# Patient Record
Sex: Male | Born: 1935 | Race: White | Hispanic: No | Smoking: Never smoker
Health system: Southern US, Community
[De-identification: ages and names within clinical notes are randomized; demographics above are authoritative.]

## PROBLEM LIST (undated history)

## (undated) DIAGNOSIS — K219 Gastro-esophageal reflux disease without esophagitis: Secondary | ICD-10-CM

## (undated) DIAGNOSIS — H9190 Unspecified hearing loss, unspecified ear: Secondary | ICD-10-CM

## (undated) DIAGNOSIS — D229 Melanocytic nevi, unspecified: Secondary | ICD-10-CM

## (undated) DIAGNOSIS — R2689 Other abnormalities of gait and mobility: Secondary | ICD-10-CM

## (undated) DIAGNOSIS — I10 Essential (primary) hypertension: Secondary | ICD-10-CM

## (undated) DIAGNOSIS — J449 Chronic obstructive pulmonary disease, unspecified: Secondary | ICD-10-CM

## (undated) DIAGNOSIS — C4492 Squamous cell carcinoma of skin, unspecified: Secondary | ICD-10-CM

## (undated) DIAGNOSIS — Z77090 Contact with and (suspected) exposure to asbestos: Secondary | ICD-10-CM

## (undated) DIAGNOSIS — C4491 Basal cell carcinoma of skin, unspecified: Secondary | ICD-10-CM

## (undated) HISTORY — PX: OTHER SURGICAL HISTORY: SHX169

## (undated) HISTORY — PX: CHOLECYSTECTOMY: SHX55

## (undated) HISTORY — PX: APPENDECTOMY: SHX54

## (undated) HISTORY — PX: TONSILLECTOMY: SUR1361

## (undated) HISTORY — PX: HERNIA REPAIR: SHX51

---
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
## 2012-07-07 DIAGNOSIS — C4492 Squamous cell carcinoma of skin, unspecified: Secondary | ICD-10-CM

## 2012-07-07 HISTORY — DX: Squamous cell carcinoma of skin, unspecified: C44.92

## 2015-12-04 DIAGNOSIS — D229 Melanocytic nevi, unspecified: Secondary | ICD-10-CM

## 2015-12-04 HISTORY — DX: Melanocytic nevi, unspecified: D22.9

## 2017-01-21 ENCOUNTER — Emergency Department (HOSPITAL_COMMUNITY): Payer: Medicare Other

## 2017-01-21 ENCOUNTER — Emergency Department (HOSPITAL_COMMUNITY)
Admission: EM | Admit: 2017-01-21 | Discharge: 2017-01-21 | Disposition: A | Discharge status: Home / Self Care | Payer: Medicare Other | Attending: Emergency Medicine | Admitting: Emergency Medicine

## 2017-01-21 ENCOUNTER — Encounter (HOSPITAL_COMMUNITY): Payer: Self-pay

## 2017-01-21 DIAGNOSIS — Y999 Unspecified external cause status: Secondary | ICD-10-CM | POA: Diagnosis not present

## 2017-01-21 DIAGNOSIS — Z7982 Long term (current) use of aspirin: Secondary | ICD-10-CM | POA: Insufficient documentation

## 2017-01-21 DIAGNOSIS — I10 Essential (primary) hypertension: Secondary | ICD-10-CM | POA: Diagnosis not present

## 2017-01-21 DIAGNOSIS — S161XXA Strain of muscle, fascia and tendon at neck level, initial encounter: Secondary | ICD-10-CM | POA: Insufficient documentation

## 2017-01-21 DIAGNOSIS — Y9389 Activity, other specified: Secondary | ICD-10-CM | POA: Diagnosis not present

## 2017-01-21 DIAGNOSIS — Z79899 Other long term (current) drug therapy: Secondary | ICD-10-CM | POA: Diagnosis not present

## 2017-01-21 DIAGNOSIS — R51 Headache: Secondary | ICD-10-CM | POA: Diagnosis not present

## 2017-01-21 DIAGNOSIS — S199XXA Unspecified injury of neck, initial encounter: Secondary | ICD-10-CM | POA: Diagnosis present

## 2017-01-21 DIAGNOSIS — R911 Solitary pulmonary nodule: Secondary | ICD-10-CM | POA: Diagnosis not present

## 2017-01-21 DIAGNOSIS — Y929 Unspecified place or not applicable: Secondary | ICD-10-CM | POA: Diagnosis not present

## 2017-01-21 DIAGNOSIS — S139XXA Sprain of joints and ligaments of unspecified parts of neck, initial encounter: Secondary | ICD-10-CM

## 2017-01-21 DIAGNOSIS — J449 Chronic obstructive pulmonary disease, unspecified: Secondary | ICD-10-CM | POA: Insufficient documentation

## 2017-01-21 HISTORY — DX: Essential (primary) hypertension: I10

## 2017-01-21 HISTORY — DX: Contact with and (suspected) exposure to asbestos: Z77.090

## 2017-01-21 HISTORY — DX: Chronic obstructive pulmonary disease, unspecified: J44.9

## 2017-01-21 MED ORDER — IBUPROFEN 600 MG PO TABS
600.0000 mg | ORAL_TABLET | Freq: Four times a day (QID) | ORAL | 0 refills | Status: AC | PRN
Start: 2017-01-21 — End: ?

## 2017-01-21 MED ORDER — METHOCARBAMOL 500 MG PO TABS
500.0000 mg | ORAL_TABLET | Freq: Two times a day (BID) | ORAL | 0 refills | Status: AC | PRN
Start: 2017-01-21 — End: ?

## 2017-01-21 MED ORDER — IBUPROFEN 800 MG PO TABS
800.0000 mg | ORAL_TABLET | Freq: Once | ORAL | Status: AC
  Administered 2017-01-21: 800 mg via ORAL
  Filled 2017-01-21: qty 1

## 2017-01-21 MED ORDER — METHOCARBAMOL 500 MG PO TABS
500.0000 mg | ORAL_TABLET | Freq: Once | ORAL | Status: AC
  Administered 2017-01-21: 500 mg via ORAL
  Filled 2017-01-21: qty 1

## 2017-01-21 NOTE — Discharge Instructions (Signed)
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
## 2017-05-10 DIAGNOSIS — C4491 Basal cell carcinoma of skin, unspecified: Secondary | ICD-10-CM

## 2017-05-10 HISTORY — DX: Basal cell carcinoma of skin, unspecified: C44.91

## 2018-05-04 DIAGNOSIS — C4491 Basal cell carcinoma of skin, unspecified: Secondary | ICD-10-CM

## 2018-05-04 HISTORY — DX: Basal cell carcinoma of skin, unspecified: C44.91

## 2021-03-15 ENCOUNTER — Emergency Department (HOSPITAL_COMMUNITY): Payer: Medicare Other

## 2021-03-15 ENCOUNTER — Other Ambulatory Visit: Payer: Self-pay

## 2021-03-15 ENCOUNTER — Inpatient Hospital Stay (HOSPITAL_COMMUNITY)
Admission: EM | Admit: 2021-03-15 | Discharge: 2021-03-17 | DRG: 055 | Disposition: A | Discharge status: Home Health | Payer: Medicare Other | Attending: Internal Medicine | Admitting: Internal Medicine

## 2021-03-15 ENCOUNTER — Encounter (HOSPITAL_COMMUNITY): Payer: Self-pay | Admitting: Emergency Medicine

## 2021-03-15 DIAGNOSIS — J449 Chronic obstructive pulmonary disease, unspecified: Secondary | ICD-10-CM

## 2021-03-15 DIAGNOSIS — I739 Peripheral vascular disease, unspecified: Secondary | ICD-10-CM

## 2021-03-15 DIAGNOSIS — Z85828 Personal history of other malignant neoplasm of skin: Secondary | ICD-10-CM

## 2021-03-15 DIAGNOSIS — F4321 Adjustment disorder with depressed mood: Secondary | ICD-10-CM

## 2021-03-15 DIAGNOSIS — Z9049 Acquired absence of other specified parts of digestive tract: Secondary | ICD-10-CM

## 2021-03-15 DIAGNOSIS — Z77090 Contact with and (suspected) exposure to asbestos: Secondary | ICD-10-CM | POA: Diagnosis present

## 2021-03-15 DIAGNOSIS — I69398 Other sequelae of cerebral infarction: Secondary | ICD-10-CM

## 2021-03-15 DIAGNOSIS — I10 Essential (primary) hypertension: Secondary | ICD-10-CM

## 2021-03-15 DIAGNOSIS — Z66 Do not resuscitate: Secondary | ICD-10-CM | POA: Diagnosis present

## 2021-03-15 DIAGNOSIS — C719 Malignant neoplasm of brain, unspecified: Principal | ICD-10-CM | POA: Diagnosis present

## 2021-03-15 DIAGNOSIS — H532 Diplopia: Secondary | ICD-10-CM | POA: Diagnosis not present

## 2021-03-15 DIAGNOSIS — Z20822 Contact with and (suspected) exposure to covid-19: Secondary | ICD-10-CM | POA: Diagnosis present

## 2021-03-15 DIAGNOSIS — K219 Gastro-esophageal reflux disease without esophagitis: Secondary | ICD-10-CM | POA: Diagnosis present

## 2021-03-15 DIAGNOSIS — H9190 Unspecified hearing loss, unspecified ear: Secondary | ICD-10-CM | POA: Diagnosis present

## 2021-03-15 DIAGNOSIS — F4320 Adjustment disorder, unspecified: Secondary | ICD-10-CM | POA: Diagnosis present

## 2021-03-15 DIAGNOSIS — G459 Transient cerebral ischemic attack, unspecified: Secondary | ICD-10-CM

## 2021-03-15 DIAGNOSIS — Z91030 Bee allergy status: Secondary | ICD-10-CM

## 2021-03-15 DIAGNOSIS — Z888 Allergy status to other drugs, medicaments and biological substances status: Secondary | ICD-10-CM

## 2021-03-15 HISTORY — DX: Unspecified hearing loss, unspecified ear: H91.90

## 2021-03-15 HISTORY — DX: Other abnormalities of gait and mobility: R26.89

## 2021-03-15 HISTORY — DX: Gastro-esophageal reflux disease without esophagitis: K21.9

## 2021-03-15 LAB — URINALYSIS, ROUTINE W REFLEX MICROSCOPIC
Bacteria, UA: NONE SEEN
Bilirubin Urine: NEGATIVE
Glucose, UA: NEGATIVE mg/dL
Ketones, ur: NEGATIVE mg/dL
Nitrite: NEGATIVE
Protein, ur: NEGATIVE mg/dL
Specific Gravity, Urine: 1.017 (ref 1.005–1.030)
WBC, UA: >50 WBC/hpf — ABNORMAL HIGH (ref 0–5)
pH: 6 (ref 5.0–8.0)

## 2021-03-15 LAB — RAPID URINE DRUG SCREEN, HOSP PERFORMED
Amphetamines: NOT DETECTED
Barbiturates: NOT DETECTED
Benzodiazepines: POSITIVE — AB
Cocaine: NOT DETECTED
Opiates: NOT DETECTED
Tetrahydrocannabinol: NOT DETECTED

## 2021-03-15 LAB — I-STAT CHEM 8, ED
BUN: 20 mg/dL (ref 8–23)
Calcium, Ion: 1.18 mmol/L (ref 1.15–1.40)
Chloride: 99 mmol/L (ref 98–111)
Creatinine, Ser: 1.1 mg/dL (ref 0.61–1.24)
Glucose, Bld: 109 mg/dL — ABNORMAL HIGH (ref 70–99)
HCT: 46 % (ref 39.0–52.0)
Hemoglobin: 15.6 g/dL (ref 13.0–17.0)
Potassium: 3.4 mmol/L — ABNORMAL LOW (ref 3.5–5.1)
Sodium: 144 mmol/L (ref 135–145)
TCO2: 35 mmol/L — ABNORMAL HIGH (ref 22–32)

## 2021-03-15 LAB — CBC WITH DIFFERENTIAL/PLATELET
Abs Immature Granulocytes: 0.03 10*3/uL (ref 0.00–0.07)
Basophils Absolute: 0 10*3/uL (ref 0.0–0.1)
Basophils Relative: 0 %
Eosinophils Absolute: 0.5 10*3/uL (ref 0.0–0.5)
Eosinophils Relative: 5 %
HCT: 47.9 % (ref 39.0–52.0)
Hemoglobin: 14.8 g/dL (ref 13.0–17.0)
Immature Granulocytes: 0 %
Lymphocytes Relative: 39 %
Lymphs Abs: 3.6 10*3/uL (ref 0.7–4.0)
MCH: 30 pg (ref 26.0–34.0)
MCHC: 30.9 g/dL (ref 30.0–36.0)
MCV: 97.2 fL (ref 80.0–100.0)
Monocytes Absolute: 0.7 10*3/uL (ref 0.1–1.0)
Monocytes Relative: 7 %
Neutro Abs: 4.5 10*3/uL (ref 1.7–7.7)
Neutrophils Relative %: 49 %
Platelets: 136 10*3/uL — ABNORMAL LOW (ref 150–400)
RBC: 4.93 MIL/uL (ref 4.22–5.81)
RDW: 12.5 % (ref 11.5–15.5)
WBC: 9.2 10*3/uL (ref 4.0–10.5)
nRBC: 0 % (ref 0.0–0.2)

## 2021-03-15 LAB — ETHANOL: Alcohol, Ethyl (B): <10 mg/dL (ref <10)

## 2021-03-15 LAB — BASIC METABOLIC PANEL
Anion gap: 9 (ref 5–15)
BUN: 17 mg/dL (ref 8–23)
CO2: 32 mmol/L (ref 22–32)
Calcium: 8.8 mg/dL — ABNORMAL LOW (ref 8.9–10.3)
Chloride: 100 mmol/L (ref 98–111)
Creatinine, Ser: 1.07 mg/dL (ref 0.61–1.24)
GFR, Estimated: >60 mL/min (ref >60)
Glucose, Bld: 117 mg/dL — ABNORMAL HIGH (ref 70–99)
Potassium: 3.4 mmol/L — ABNORMAL LOW (ref 3.5–5.1)
Sodium: 141 mmol/L (ref 135–145)

## 2021-03-15 LAB — HEPATIC FUNCTION PANEL
ALT: 32 U/L (ref 0–44)
AST: 27 U/L (ref 15–41)
Albumin: 3.6 g/dL (ref 3.5–5.0)
Alkaline Phosphatase: 72 U/L (ref 38–126)
Bilirubin, Direct: 0.1 mg/dL (ref 0.0–0.2)
Indirect Bilirubin: 0.5 mg/dL (ref 0.3–0.9)
Total Bilirubin: 0.6 mg/dL (ref 0.3–1.2)
Total Protein: 6.5 g/dL (ref 6.5–8.1)

## 2021-03-15 LAB — APTT: aPTT: 30 seconds (ref 24–36)

## 2021-03-15 LAB — TROPONIN I (HIGH SENSITIVITY)
Troponin I (High Sensitivity): 9 ng/L (ref <18)
Troponin I (High Sensitivity): 9 ng/L (ref <18)

## 2021-03-15 LAB — RESP PANEL BY RT-PCR (FLU A&B, COVID) ARPGX2
Influenza A by PCR: NEGATIVE
Influenza B by PCR: NEGATIVE
SARS Coronavirus 2 by RT PCR: NEGATIVE

## 2021-03-15 LAB — PROTIME-INR
INR: 1 (ref 0.8–1.2)
Prothrombin Time: 12.4 seconds (ref 11.4–15.2)

## 2021-03-15 MED ORDER — STROKE: EARLY STAGES OF RECOVERY BOOK
Freq: Once | Status: DC
  Filled 2021-03-15: qty 1

## 2021-03-15 MED ORDER — HYDRALAZINE HCL 20 MG/ML IJ SOLN
5.0000 mg | INTRAMUSCULAR | Status: DC | PRN
  Administered 2021-03-16: 5 mg via INTRAVENOUS
  Filled 2021-03-15: qty 1

## 2021-03-15 MED ORDER — SENNOSIDES-DOCUSATE SODIUM 8.6-50 MG PO TABS
1.0000 | ORAL_TABLET | Freq: Every day | ORAL | Status: DC
  Administered 2021-03-15 – 2021-03-16 (×2): 1 via ORAL
  Filled 2021-03-15 (×2): qty 1

## 2021-03-15 MED ORDER — POTASSIUM CHLORIDE CRYS ER 20 MEQ PO TBCR
20.0000 meq | EXTENDED_RELEASE_TABLET | Freq: Every day | ORAL | Status: DC
  Administered 2021-03-16 – 2021-03-17 (×2): 20 meq via ORAL
  Filled 2021-03-15 (×2): qty 1

## 2021-03-15 MED ORDER — ENOXAPARIN SODIUM 40 MG/0.4ML ~~LOC~~ SOLN
40.0000 mg | SUBCUTANEOUS | Status: DC
  Administered 2021-03-15 – 2021-03-16 (×2): 40 mg via SUBCUTANEOUS
  Filled 2021-03-15 (×2): qty 0.4

## 2021-03-15 MED ORDER — DEXTROSE-NACL 5-0.45 % IV SOLN: INTRAVENOUS | Status: DC

## 2021-03-15 MED ORDER — ASCORBIC ACID 500 MG PO TABS
1000.0000 mg | ORAL_TABLET | Freq: Every day | ORAL | Status: DC
  Administered 2021-03-16 – 2021-03-17 (×2): 1000 mg via ORAL
  Filled 2021-03-15 (×5): qty 2

## 2021-03-15 MED ORDER — ASPIRIN EC 81 MG PO TBEC
81.0000 mg | DELAYED_RELEASE_TABLET | Freq: Every day | ORAL | Status: DC
  Administered 2021-03-16 – 2021-03-17 (×2): 81 mg via ORAL
  Filled 2021-03-15 (×2): qty 1

## 2021-03-15 MED ORDER — HYDRALAZINE HCL 20 MG/ML IJ SOLN
5.0000 mg | Freq: Once | INTRAMUSCULAR | Status: AC
  Administered 2021-03-15: 5 mg via INTRAVENOUS
  Filled 2021-03-15: qty 1

## 2021-03-15 MED ORDER — ACETAMINOPHEN 160 MG/5ML PO SOLN: 650.0000 mg | ORAL | Status: DC | PRN

## 2021-03-15 MED ORDER — DIGOXIN 125 MCG PO TABS
0.1250 mg | ORAL_TABLET | ORAL | Status: DC
  Administered 2021-03-17: 0.125 mg via ORAL
  Filled 2021-03-15: qty 1

## 2021-03-15 MED ORDER — UMECLIDINIUM BROMIDE 62.5 MCG/INH IN AEPB
1.0000 | INHALATION_SPRAY | Freq: Every day | RESPIRATORY_TRACT | Status: DC
  Administered 2021-03-16 – 2021-03-17 (×2): 1 via RESPIRATORY_TRACT
  Filled 2021-03-15: qty 7

## 2021-03-15 MED ORDER — METOPROLOL SUCCINATE ER 25 MG PO TB24
75.0000 mg | ORAL_TABLET | Freq: Every day | ORAL | Status: DC
  Administered 2021-03-16 – 2021-03-17 (×2): 75 mg via ORAL
  Filled 2021-03-15 (×2): qty 1

## 2021-03-15 MED ORDER — ACETAMINOPHEN 650 MG RE SUPP: 650.0000 mg | RECTAL | Status: DC | PRN

## 2021-03-15 MED ORDER — ROSUVASTATIN CALCIUM 20 MG PO TABS
40.0000 mg | ORAL_TABLET | Freq: Every day | ORAL | Status: DC
  Administered 2021-03-15 – 2021-03-16 (×2): 40 mg via ORAL
  Filled 2021-03-15 (×2): qty 2

## 2021-03-15 MED ORDER — METHOCARBAMOL 500 MG PO TABS: 500.0000 mg | ORAL_TABLET | Freq: Two times a day (BID) | ORAL | Status: DC | PRN

## 2021-03-15 MED ORDER — HYDROCHLOROTHIAZIDE 12.5 MG PO CAPS
12.5000 mg | ORAL_CAPSULE | Freq: Every day | ORAL | Status: DC
  Administered 2021-03-16 – 2021-03-17 (×2): 12.5 mg via ORAL
  Filled 2021-03-15 (×2): qty 1

## 2021-03-15 MED ORDER — ACETAMINOPHEN 325 MG PO TABS
650.0000 mg | ORAL_TABLET | ORAL | Status: DC | PRN
  Administered 2021-03-16: 650 mg via ORAL
  Filled 2021-03-15: qty 2

## 2021-03-15 MED ORDER — ENSURE ENLIVE PO LIQD
237.0000 mL | Freq: Two times a day (BID) | ORAL | Status: DC
  Administered 2021-03-16: 237 mL via ORAL

## 2021-03-15 MED ORDER — IBUPROFEN 600 MG PO TABS: 600.0000 mg | ORAL_TABLET | Freq: Four times a day (QID) | ORAL | Status: DC | PRN

## 2021-03-15 MED ORDER — IRBESARTAN 150 MG PO TABS
150.0000 mg | ORAL_TABLET | Freq: Every day | ORAL | Status: DC
  Administered 2021-03-15 – 2021-03-17 (×3): 150 mg via ORAL
  Filled 2021-03-15 (×3): qty 1

## 2021-03-15 NOTE — ED Notes (Signed)
Son at bedside. PA notified.

## 2021-03-15 NOTE — ED Provider Notes (Signed)
[facility] EMERGENCY DEPARTMENT Provider Note   CSN: [id number] Arrival date & time: 03/15/21  1400     History Chief Complaint  Patient presents with  . Hypertension    Charles Middleton is a 87 y.o. male history of asbestos, COPD, hypertension.  Patient reports 11 days ago his wife of 37 years passed away.  He reports that since that time he has had increased stress.  His daughter has been coming over to take care of him and has been checking his blood pressure.  Patient reports that his blood pressure has been high over the past few days and reports systolics greater than 614 at home.  Patient reports that he has been taking his blood pressure medication as prescribed.  Patient reports that over the past 3-4 days he has been noticing double vision particularly in the mornings.  Patient reports his shortness of breath today is baseline he wears supplemental oxygen only at night, he does not feel that he has a COPD exacerbation at this time.  Patient denies recent illness, fever/chills, headache, confusion, difficulty speaking, chest pain/shortness of breath, cough, Donnell pain, nausea/vomiting, diarrhea or any additional concerns.  HPI     Past Medical History:  Diagnosis Date  . Asbestos exposure   . Atypical nevus 12/04/2015   mild atypia - left outer back  . Atypical nevus 05/10/2017   moderate atypia - right chest  . Basal cell carcinoma of skin 05/10/2017   Left ear rim - tx p bx  . COPD (chronic obstructive pulmonary disease) ([place])   . Hypertension   . 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 Nodular basal cell carcinoma 10/20/2018   Right side of nose - tx p bx  . Squamous cell carcinoma of skin 07/07/2012   Right ear rim - tx p bx  . Squamous cell carcinoma of skin 11/12/2014   Right hand - tx p bx  . Squamous cell carcinoma of skin 10/20/2018   Right ear rim - tx p bx    There are no problems to display for this patient.   Past  Surgical History:  Procedure Laterality Date  . APPENDECTOMY    . cataract sx    . CHOLECYSTECTOMY    . HERNIA REPAIR         History reviewed. No pertinent family history.  Social History   Tobacco Use  . Smoking status: Never Smoker  . Smokeless tobacco: Never Used  Vaping Use  . Vaping Use: Never used  Substance Use Topics  . Alcohol use: No  . Drug use: No    Home Medications Prior to Admission medications   Medication Sig Start Date End Date Taking? Authorizing Provider  Ascorbic Acid (VITAMIN C) 500 MG CAPS Take 1,000 mg by mouth daily.  11/22/11   [provider]  aspirin EC 81 MG tablet Take 81 mg by mouth daily.  11/22/11   [provider]  azelastine (ASTELIN) 0.1 % nasal spray 1 spray by Each Nare route once daily. Use in each nostril as directed    [provider]  digoxin (LANOXIN) 0.125 MG tablet Take 0.125 mg by mouth daily.  01/16/17   [provider]  hydrochlorothiazide (MICROZIDE) 12.5 MG capsule Take 12.5 mg by mouth daily. 02/02/21   [provider]  ibuprofen (ADVIL,MOTRIN) 600 MG tablet Take 1 tablet (600 mg total) by mouth every 6 (six) hours as needed. 01/21/17   Sabra Heck,  Aaron Edelman, MD  irbesartan (AVAPRO) 150 MG tablet Take 150 mg by mouth daily. 02/21/21   [provider]  methocarbamol (ROBAXIN) 500 MG tablet Take 1 tablet (500 mg total) by mouth 2 (two) times daily as needed for muscle spasms. 01/21/17   Noemi Chapel, MD  Multiple Vitamins-Minerals (COMPLETE) TABS Take 1 tablet by mouth daily.  11/22/11   [provider]  Omega-3 Fatty Acids (FISH OIL PO) Take 1 capsule by mouth daily.  11/22/11   [provider]  potassium chloride SA (KLOR-CON) 20 MEQ tablet Take 20 mEq by mouth daily. 01/23/21   [provider]  TOPROL XL 50 MG 24 hr tablet Take 75 mg by mouth daily.  11/22/16   [provider]  TUDORZA PRESSAIR 400 MCG/ACT AEPB Inhale 1 Inhaler into the lungs daily.  10/27/16    [provider]    Allergies    Amlodipine and Bee venom  Review of Systems   Review of Systems Ten systems are reviewed and are negative for acute change except as noted in the HPI  Physical Exam Updated Vital Signs BP (!) 185/77   Pulse (!) 59   Temp 97.9 F (36.6 C) (Oral)   Resp 16   Ht 6' (1.829 m)   Wt 88.5 kg   SpO2 95%   BMI 26.45 kg/m   Physical Exam Constitutional:      General: He is not in acute distress.    Appearance: Normal appearance. He is well-developed. He is not ill-appearing or diaphoretic.  HENT:     Head: Normocephalic and atraumatic.  Eyes:     General: Vision grossly intact. Gaze aligned appropriately.     Pupils: Pupils are equal, round, and reactive to light.  Neck:     Trachea: Trachea and phonation normal.  Cardiovascular:     Comments: Trace bilateral lower extremity edema. Pulmonary:     Effort: Pulmonary effort is normal. No respiratory distress.  Abdominal:     General: There is no distension.     Palpations: Abdomen is soft.     Tenderness: There is no abdominal tenderness. There is no guarding or rebound.  Musculoskeletal:        General: Normal range of motion.     Cervical back: Normal range of motion.  Skin:    General: Skin is warm and dry.  Neurological:     Mental Status: He is alert.     GCS: GCS eye subscore is 4. GCS verbal subscore is 5. GCS motor subscore is 6.     Comments: Speech is clear and goal oriented, follows commands Major Cranial nerves without deficit, no facial droop Moves extremities without ataxia, coordination intact  Psychiatric:        Behavior: Behavior normal.     ED Results / Procedures / Treatments   Labs (all labs ordered are listed, but only abnormal results are displayed) Labs Reviewed  CBC WITH DIFFERENTIAL/PLATELET - Abnormal; Notable for the following components:      Result Value   Platelets 136 (*)    All other components within normal limits  BASIC METABOLIC PANEL -  Abnormal; Notable for the following components:   Potassium 3.4 (*)    Glucose, Bld 117 (*)    Calcium 8.8 (*)    All other components within normal limits  I-STAT CHEM 8, ED - Abnormal; Notable for the following components:   Potassium 3.4 (*)    Glucose, Bld 109 (*)    TCO2  35 (*)    All other components within normal limits  RESP PANEL BY RT-PCR (FLU A&B, COVID) ARPGX2  APTT  PROTIME-INR  ETHANOL  HEPATIC FUNCTION PANEL  URINALYSIS, ROUTINE W REFLEX MICROSCOPIC  RAPID URINE DRUG SCREEN, HOSP PERFORMED  TROPONIN I (HIGH SENSITIVITY)    EKG EKG Interpretation  Date/Time:  Sunday March 15 2021 14:29:39 EDT Ventricular Rate:  58 PR Interval:    QRS Duration: 88 QT Interval:  412 QTC Calculation: 405 R Axis:   -54 Text Interpretation: Sinus rhythm Prolonged PR interval Inferior infarct, old Anterior infarct, old No previous ECGs available Confirmed by Fredia Sorrow [phone]) on 03/15/2021 2:52:03 PM   Radiology CT Head Wo Contrast  Result Date: 03/15/2021 CLINICAL DATA:  Dizziness. Hypertension and double vision for 2 days. EXAM: CT HEAD WITHOUT CONTRAST TECHNIQUE: Contiguous axial images were obtained from the base of the skull through the vertex without intravenous contrast. COMPARISON:  None. FINDINGS: Brain: No evidence of acute infarction, hemorrhage, hydrocephalus, extra-axial collection or mass lesion/mass effect. There is mild diffuse low-attenuation within the subcortical and periventricular white matter compatible with chronic microvascular disease. Prominence of sulci and ventricles compatible with brain atrophy Vascular: No hyperdense vessel or unexpected calcification. Skull: Normal. Negative for fracture or focal lesion. Sinuses/Orbits: Mild right maxillary sinus mucosal thickening. The remaining paranasal sinuses are clear. Normal appearance of the right maxillary sinus. Signs of previous left mastoidectomy identified. Other: None. IMPRESSION: 1. No acute intracranial  abnormalities. 2. Chronic small vessel ischemic disease and brain atrophy. Electronically Signed   By: Kerby Moors M.D.   On: 03/15/2021 15:49   DG Chest Portable 1 View  Result Date: 03/15/2021 CLINICAL DATA:  Hypertension. Patient reports double vision for 2 days. EXAM: PORTABLE CHEST 1 VIEW COMPARISON:  None. FINDINGS: Mild cardiomegaly. Aortic atherosclerosis. No pulmonary edema. Subsegmental opacities at the left lung base may be atelectasis or scarring. No confluent consolidation. No pneumothorax or significant pleural effusion. No acute osseous abnormalities are seen. There is colonic interposition under the right hemidiaphragm. IMPRESSION: 1. Mild cardiomegaly without congestive failure. 2. Subsegmental opacities at the left lung base may be atelectasis or scarring. Electronically Signed   By: Keith Rake M.D.   On: 03/15/2021 15:51    Procedures Procedures   Medications Ordered in ED Medications - No data to display  ED Course  I have reviewed the triage vital signs and the nursing notes.  Pertinent labs & imaging results that were available during my care of the patient were reviewed by me and considered in my medical decision making (see chart for details).    MDM Rules/Calculators/A&P                         Additional history obtained from: 1. Nursing notes from this visit. 2. Patient's family members. 3. Review electronic medical records. --- 87 year old male presented for hypertension today, experiencing diplopia x3-4 days otherwise no complaints.  He has been compliant with his blood pressure medication.  Case discussed with attending physician Dr. Rogene Houston, will obtain CT head and consult teleneuro.  Will allow permissible hypertension for now until neurology consultation. - I ordered, reviewed and interpreted labs which include: I-STAT Chem-8 shows mild hypokalemia 3.4. Troponin within normal limits. BMP shows mild hypokalemia 3.4, no emergent electrolyte  derangement, AKI or gap. LFTs within normal limits. Ethanol negative. INR within normal limits. BMP shows thrombocytopenia 136, no leukocytosis or anemia.  EKG: Sinus rhythm Prolonged PR interval Inferior infarct,  old Anterior infarct, old No previous ECGs available Confirmed by Fredia Sorrow [phone]) on 03/15/2021 2:52:03 PM  CXR:  IMPRESSION:  1. Mild cardiomegaly without congestive failure.  2. Subsegmental opacities at the left lung base may be atelectasis  or scarring.   CT Head:  IMPRESSION:  1. No acute intracranial abnormalities.  2. Chronic small vessel ischemic disease and brain atrophy.  - Patient evaluated by teleneurology, their diagnosis TIA, they recommend medicine admission, ASA, MRI brain, lipid panel/A1c among other recommendations.  Patient reassessed resting comfortably bed no acute distress no current diplopia, patient and his son at bedside state understanding of care plan they are agreeable for admission.  Consult placed to medicine service. - 4:14 PM: Consult with Dr. Linda Hedges, patient accepted to medicine service.   Note: Portions of this report may have been transcribed using voice recognition software. Every effort was made to ensure accuracy; however, inadvertent computerized transcription errors may still be present. Final Clinical Impression(s) / ED Diagnoses Final diagnoses:  Diplopia  Hypertension, unspecified type  TIA (transient ischemic attack)    Rx / DC Orders ED Discharge Orders    None       Gari Crown 03/15/21 1614    Fredia Sorrow, MD 03/23/21 [phone]

## 2021-03-15 NOTE — ED Notes (Signed)
PA aware of VS and is at bedside.

## 2021-03-15 NOTE — ED Triage Notes (Addendum)
Pt to the ED with Hypertension x1 wk. Pt states his systolic pressures have been running over 200.   Pt is normally seen at the [facility].

## 2021-03-15 NOTE — ED Provider Notes (Signed)
I provided a substantive portion of the care of this patient.  I personally performed the entirety of the history, exam and medical decision making for this encounter.  EKG Interpretation  Date/Time:  Sunday March 15 2021 14:29:39 EDT Ventricular Rate:  58 PR Interval:    QRS Duration: 88 QT Interval:  412 QTC Calculation: 405 R Axis:   -54 Text Interpretation: Sinus rhythm Prolonged PR interval Inferior infarct, old Anterior infarct, old No previous ECGs available Confirmed by Fredia Sorrow [phone]) on 03/15/2021 2:52:03 PM  Patient seen by me along with physician assistant.  Patient's been having difficulty with elevated blood pressure at home.  States his daughter is been checking his blood pressure states he is acting sluggish.  For the last few days he has been having intermittent double vision.  Is not present currently.  States that systolic blood pressures have been in the 200s.  Patient has been taking his hypertensive meds.  Chart review shows that he was seen in the emergency department in [facility] yesterday.  They did a work-up.  Patient states they did not do anything.  Patient denies any headache or any speech problems or any motor weakness.  Patient has a history of COPD but does not wear oxygen.  Pressure has been high here systolics have been ranging from 1 73-2 15.  Head CT in process.  But because of the double vision intermittent will do a teleneurology consult.  Not for code stroke.  Disposition will be based on their recommendations.  With the exception blood pressure does seem to be high there would be some question of whether internal medicine would need to admit for blood pressure control.  Blood pressures can be followed here they may show some signs of improvement.  They have already shown some signs of improvement trend has been the most recent one was 053 systolic.  It is not clear who patient's primary care doctor is.  It looks like the last time we have anything in  her records he was seen by [facility] back in 2017.  Physician assistant Nicki Reaper contact family member to see who is following him from a primary care standpoint.  Rest of his work-up labs and i-STAT Chem-8 and basic metabolic panel without any significant changes other than with other than some slight hypokalemia with a potassium of 3.4.   Fredia Sorrow, MD 03/15/21 [phone]

## 2021-03-15 NOTE — ED Notes (Signed)
Pt here due to elevated BP noted by family at home. States his daughter has been checking his blood pressure daily "because she says I'm acting sluggish." Pt reports they noticed SBP in 200's. Pt is compliant with home medications to tx HTN, no recent changes to his medications. States his wife died 11 days ago and he has not felt well since, unsure if his stress may be affecting his BP. Also has COPD, intermittent exertional dyspnea, does not wear oxygen. Lungs CTA. Denies pain. Denies recent fevers, cough, abd complaints. Connected to monitors. POC has been explained and he expressed understanding. Call bell in reach. Bed low and locked.

## 2021-03-15 NOTE — Consult Note (Addendum)
   [facility] TeleNeurology Consult Services  Stat Consult  Date of Service:   03/15/2021 15:11:05  Diagnosis:     .  G45.9 - Transient cerebral ischemic attack, unspecified  Impression: Patient presented with intermittent binocular diplopia. Head CT showed no acute process. due to comorbidity might benefit from inpatient evaluation for stroke. Would recommend ASA for secondary prevention.  CT HEAD: Showed No Acute Hemorrhage or Acute Core Infarct  Our recommendations are outlined below.  Diagnostic Studies: Recommend MRI brain without contrast  Laboratory Studies: Recommend Lipid panel Hemoglobin A1c  Medication: Initiate Aspirin 81 mg daily  Nursing Recommendations: Telemetry, IV Fluids, avoid dextrose containing fluids, Maintain euglycemia Neuro checks q4 hrs x 24 hrs and then per shift Head of bed 30 degrees  Consultations: Recommend Speech therapy if failed dysphagia screen Physical therapy/Occupational therapy  DVT Prophylaxis: SCDs, Pneumatic Compression Lovenox or LMW Heparin  Disposition: Neurology will follow   Metrics: [facility] Notification Time: 03/15/2021 15:08:55 Stamp Time: 03/15/2021 15:11:05 Callback Response Time: 03/15/2021 15:12:37   ----------------------------------------------------------------------------------------------------  Chief Complaint: Double vision  History of Present Illness: Patient is a 87 year old Male.  Past Medical history of hypertension presented with double vision in the setting of hypertension. Patient stated that the high bp has been going on for the past 4 days. Was seen in ER yesterday for elevated BP. BUt it remained high even today. No weakness, no numbness no dysarthria, no gait disturbance.          Examination: BP(191/70), Pulse(85), Blood Glucose(117) 1A: Level of Consciousness - Alert; keenly responsive + 0 1B: Ask Month and Age - Both Questions Right + 0 1C: Blink Eyes & Squeeze  Hands - Performs Both Tasks + 0 2: Test Horizontal Extraocular Movements - Normal + 0 3: Test Visual Fields - No Visual Loss + 0 4: Test Facial Palsy (Use Grimace if Obtunded) - Normal symmetry + 0 5A: Test Left Arm Motor Drift - No Drift for 10 Seconds + 0 5B: Test Right Arm Motor Drift - No Drift for 10 Seconds + 0 6A: Test Left Leg Motor Drift - No Drift for 5 Seconds + 0 6B: Test Right Leg Motor Drift - No Drift for 5 Seconds + 0 7: Test Limb Ataxia (FNF/Heel-Shin) - No Ataxia + 0 8: Test Sensation - Normal; No sensory loss + 0 9: Test Language/Aphasia - Normal; No aphasia + 0 10: Test Dysarthria - Normal + 0 11: Test Extinction/Inattention - No abnormality + 0  NIHSS Score: 0    Patient / Family was informed the Neurology Consult would occur via TeleHealth consult by way of interactive audio and video telecommunications and consented to receiving care in this manner.  Patient is being evaluated for possible acute neurologic impairment and high probability of imminent or life - threatening deterioration.I spent total of 22 minutes providing care to this patient, including time for face to face visit via telemedicine, review of medical records, imaging studies and discussion of findings with providers, the patient and / or family.   Dr Hinda Lenis Lalo Tromp   [facility] [phone]  Case [id number]

## 2021-03-15 NOTE — ED Notes (Addendum)
Teleneuro responded. Pt currently in CT.

## 2021-03-15 NOTE — ED Notes (Signed)
Dr. Linda Hedges at bedside, received verbal order for HTN.

## 2021-03-15 NOTE — H&P (Signed)
History and Physical    Charles Middleton MPN:[id number] DOB: [birth year] DOA: 03/15/2021  PCP: [name], MD (Confirm with patient/family/NH records and if not entered, this has to be entered at [facility] point of entry) Patient coming from: home  I have personally briefly reviewed patient's old medical records in [place]  Chief Complaint: intermittent diploplia, elevated blood pressure, low energy  HPI: Charles Middleton is a 87 y.o. male with medical history significant of HTN, GERD, HOH, COPD but not oxygen or steroid dependent. [facility]. Evaluation was unremarkable. Covid testing was negative. He is evidently recently widowed after a long marriage. His daughter reports difficulty with elevated blood pressure at home. His daughter is been checking his blood pressure. She also states he is acting sluggish.  For the last few days he has been having intermittent double vision but not present currently.  States that systolic blood pressures have been in the 200s.  Patient has been taking his hypertensive meds. Patient denies any headache or any speech problems or any motor weakness.    ED Course: 97.9  185/77  HR 59  RR 16.  ED-PA exam notable for trace bilateral LE edema, normal neuro exam. CT head w/o acute findings but chronic small vessel disease. Lab results unremarkable. CXR with mild cardiomegaly. CXR w/o acute disease. Tele neuro consultation: recommended admission for evaluation of possible TIA/CVA to include MRI brain w/o contrast. TRH called to admit. Review of Systems: As per HPI otherwise 10 point review of systems negative.   Past Medical History:  Diagnosis Date  . Abnormality of gait due to impairment of balance   . Asbestos exposure   . Atypical nevus 12/04/2015   mild atypia - left outer back  . Atypical nevus 05/10/2017   moderate atypia - right chest  . Basal cell carcinoma of skin 05/10/2017   Left ear rim - tx p bx  . COPD  (chronic obstructive pulmonary disease) ([place])   . GERD (gastroesophageal reflux disease)   . Hearing loss   . Hypertension   . 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 Nodular basal cell carcinoma 10/20/2018   Right side of nose - tx p bx  . Squamous cell carcinoma of skin 07/07/2012   Right ear rim - tx p bx  . Squamous cell carcinoma of skin 11/12/2014   Right hand - tx p bx  . Squamous cell carcinoma of skin 10/20/2018   Right ear rim - tx p bx    Past Surgical History:  Procedure Laterality Date  . APPENDECTOMY    . blephroplasty    . cataract sx    . CHOLECYSTECTOMY    . HERNIA REPAIR    . TONSILLECTOMY  remote    Social Hx - served 6 years [employer]. Married 30 years, recently widowed. Lives alone but two sons and a daughter live close by. 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   reports that he has never smoked. He has never used smokeless tobacco. He reports that he does not drink alcohol and does not use drugs.  Allergies  Allergen Reactions  . Amlodipine Other (See Comments)    Swelling in feet and legs  . Bee Venom Other (See Comments)    Vision changes    History reviewed. No pertinent family history.   Prior to Admission medications   Medication Sig  Start Date End Date Taking? Authorizing Provider  Ascorbic Acid (VITAMIN C) 500 MG CAPS Take 1,000 mg by mouth daily.  11/22/11  Yes [provider]  digoxin (LANOXIN) 0.125 MG tablet Take 0.125 mg by mouth every other day. 01/16/17  Yes [provider]  hydrochlorothiazide (MICROZIDE) 12.5 MG capsule Take 12.5 mg by mouth daily. 02/02/21  Yes [provider]  irbesartan (AVAPRO) 150 MG tablet Take 150 mg by mouth daily. 02/21/21  Yes [provider]  Multiple Vitamins-Minerals (COMPLETE) TABS Take 1 tablet by mouth daily.  11/22/11  Yes [provider]  Omega-3 Fatty Acids (FISH OIL PO) Take 1 capsule by mouth daily.   11/22/11  Yes [provider]  potassium chloride SA (KLOR-CON) 20 MEQ tablet Take 20 mEq by mouth daily. 01/23/21  Yes [provider]  rosuvastatin (CRESTOR) 40 MG tablet Take 40 mg by mouth at bedtime.   Yes [provider]  TOPROL XL 50 MG 24 hr tablet Take 75 mg by mouth daily.  11/22/16  Yes [provider]  TUDORZA PRESSAIR 400 MCG/ACT AEPB Inhale 1 Inhaler into the lungs daily.  10/27/16  Yes [provider]  ibuprofen (ADVIL,MOTRIN) 600 MG tablet Take 1 tablet (600 mg total) by mouth every 6 (six) hours as needed. 01/21/17   Noemi Chapel, MD  methocarbamol (ROBAXIN) 500 MG tablet Take 1 tablet (500 mg total) by mouth 2 (two) times daily as needed for muscle spasms. 01/21/17   Noemi Chapel, MD    Physical Exam: Vitals:   03/15/21 1630 03/15/21 1645 03/15/21 1700 03/15/21 1720  BP: (!) 202/74  (!) 211/94 (!) 208/80  Pulse: (!) 54 (!) 57 (!) 58 (!) 58  Resp: 17 (!) 25 (!) 22 20  Temp:    98 F (36.7 C)  TempSrc:    Oral  SpO2: 92% 95% 91% 92%  Weight:      Height:         Vitals:   03/15/21 1630 03/15/21 1645 03/15/21 1700 03/15/21 1720  BP: (!) 202/74  (!) 211/94 (!) 208/80  Pulse: (!) 54 (!) 57 (!) 58 (!) 58  Resp: 17 (!) 25 (!) 22 20  Temp:    98 F (36.7 C)  TempSrc:    Oral  SpO2: 92% 95% 91% 92%  Weight:      Height:       General: WNWD man in no distress. Alert and coherent Eyes: PERRL, lids and conjunctivae normal ENMT: Mucous membranes are moist. Posterior pharynx clear of any exudate or lesions.Normal dentition.  Neck: normal, supple, no masses, no thyromegaly Respiratory: clear to auscultation bilaterally, no wheezing, no crackles. Normal respiratory effort. No accessory muscle use.  Cardiovascular: Regular rate and rhythm, no murmurs / rubs / gallops. 1+ LE extremity edema. O pedal pulses. 10 second capillary refill time feet.  No carotid bruits.  Abdomen: no tenderness, no masses palpated. No hepatosplenomegaly. Bowel  sounds positive.  Musculoskeletal: no clubbing / mild cyanosis feet. No joint deformity upper and lower extremities. Good ROM, no contractures. Normal muscle tone.  Skin: no rashes, lesions, ulcers. No induration Neurologic: CN 2-12 grossly intact with nl facial movement, PERRLA, EOMI, tracks normally, no diploplia, nl visual acuity. Sensation intact, . Strength 5/5 in all 4.  Psychiatric: Normal judgment and insight. Alert and oriented x 3. Normal mood.    Labs on Admission: I have personally reviewed following labs and imaging studies  CBC: Recent Labs  Lab 03/15/21 1440 03/15/21 1503  WBC 9.2  --   NEUTROABS 4.5  --   HGB 14.8 15.6  HCT 47.9 46.0  MCV 97.2  --   PLT 136*  --    Basic Metabolic Panel: Recent Labs  Lab 03/15/21 1440 03/15/21 1503  NA 141 144  K 3.4* 3.4*  CL 100 99  CO2 32  --   GLUCOSE 117* 109*  BUN 17 20  CREATININE 1.07 1.10  CALCIUM 8.8*  --    GFR: Estimated Creatinine Clearance: 45.1 mL/min (by C-G formula based on SCr of 1.1 mg/dL). Liver Function Tests: Recent Labs  Lab 03/15/21 1440  AST 27  ALT 32  ALKPHOS 72  BILITOT 0.6  PROT 6.5  ALBUMIN 3.6   No results for input(s): LIPASE, AMYLASE in the last 168 hours. No results for input(s): AMMONIA in the last 168 hours. Coagulation Profile: Recent Labs  Lab 03/15/21 1440  INR 1.0   Cardiac Enzymes: No results for input(s): CKTOTAL, CKMB, CKMBINDEX, TROPONINI in the last 168 hours. BNP (last 3 results) No results for input(s): PROBNP in the last 8760 hours. HbA1C: No results for input(s): HGBA1C in the last 72 hours. CBG: No results for input(s): GLUCAP in the last 168 hours. Lipid Profile: No results for input(s): CHOL, HDL, LDLCALC, TRIG, CHOLHDL, LDLDIRECT in the last 72 hours. Thyroid Function Tests: No results for input(s): TSH, T4TOTAL, FREET4, T3FREE, THYROIDAB in the last 72 hours. Anemia Panel: No results for input(s): VITAMINB12, FOLATE, FERRITIN, TIBC, IRON,  RETICCTPCT in the last 72 hours. Urine analysis: No results found for: COLORURINE, APPEARANCEUR, [place], [place], GLUCOSEU, HGBUR, BILIRUBINUR, KETONESUR, PROTEINUR, UROBILINOGEN, NITRITE, LEUKOCYTESUR  Radiological Exams on Admission: CT Head Wo Contrast  Result Date: 03/15/2021 CLINICAL DATA:  Dizziness. Hypertension and double vision for 2 days. EXAM: CT HEAD WITHOUT CONTRAST TECHNIQUE: Contiguous axial images were obtained from the base of the skull through the vertex without intravenous contrast. COMPARISON:  None. FINDINGS: Brain: No evidence of acute infarction, hemorrhage, hydrocephalus, extra-axial collection or mass lesion/mass effect. There is mild diffuse low-attenuation within the subcortical and periventricular white matter compatible with chronic microvascular disease. Prominence of sulci and ventricles compatible with brain atrophy Vascular: No hyperdense vessel or unexpected calcification. Skull: Normal. Negative for fracture or focal lesion. Sinuses/Orbits: Mild right maxillary sinus mucosal thickening. The remaining paranasal sinuses are clear. Normal appearance of the right maxillary sinus. Signs of previous left mastoidectomy identified. Other: None. IMPRESSION: 1. No acute intracranial abnormalities. 2. Chronic small vessel ischemic disease and brain atrophy. Electronically Signed   By: Kerby Moors M.D.   On: 03/15/2021 15:49   DG Chest Portable 1 View  Result Date: 03/15/2021 CLINICAL DATA:  Hypertension. Patient reports double vision for 2 days. EXAM: PORTABLE CHEST 1 VIEW COMPARISON:  None. FINDINGS: Mild cardiomegaly. Aortic atherosclerosis. No pulmonary edema. Subsegmental opacities at the left lung base may be atelectasis or scarring. No confluent consolidation. No pneumothorax or significant pleural effusion. No acute osseous abnormalities are seen. There is colonic interposition under the right hemidiaphragm. IMPRESSION: 1. Mild cardiomegaly without congestive failure. 2.  Subsegmental opacities at the left lung base may be atelectasis or scarring. Electronically Signed   By: Keith Rake M.D.   On: 03/15/2021 15:51    EKG: Independently reviewed. Sinus bradycardia, old inferior and anterior injury pattern w/o acute changes  Assessment/Plan Active Problems:   HTN (hypertension)   Transient diplopia   COPD (chronic obstructive pulmonary disease) (HCC)   Grief reaction   PAD (peripheral artery disease) (HCC)  1. Neuro - patient with intermittent diploplia. Risk factors include age, hypertension, hyperlipidemia. No prior h/o neurologic events. No focal symptoms and no focal findings on exam. He has long standing gait disorder/imbalance thought in 2017 to possibly be related to basal ganglia disease. Plan Med-tele observation  MRI brain w/o contrast  Resume ASA 81 mg daily  PT/OT evaluation  2. HTN- poorly controlled. Question of medication adherence. Patient gives a h/o arrythmia remotely. He has had serial Echodardiograms at the [place] and in [place] but denies knowledge of a HF diagnosis. Plan Continue home medications including lanoxin, aldactone, BB, ARB, HCTZ  Tele observation for arrythmia  Obtain ECHO results. If not possible recommend 2D echo.  PRN Hydralazine  3. 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 Currently stable Plan Continue home medication  4. PAD - advanced pedal PAD on exam. Plan He is to request formal evaluation, ABI, from his primary care provider  Podiatry for nail triming given his onychyomycosis and risk of non-healing injury  5. Grief reaction - recent loss of his wife of 51 years. Plan Recommended outpatient grief counseling.   6. Code status - with his son present patient stated clearly he did not was cardiac resuscitation in the event of cardiac arrest  7. Disposition - home in 24-28 hours.    DVT prophylaxis: lovenox  Code Status: DNR  Family  Communication: son present during exam  Disposition Plan: home 24-48 hours Consults called: teleneuro-Joao Plancher  Admission status: observation-tele    Adella Hare MD [facility] Pager [phone]  If 7PM-7AM, please contact night-coverage [email or web address] Password [email or web address]  03/15/2021, 5:22 PM

## 2021-03-15 NOTE — ED Notes (Addendum)
Teleneuro cart in room. Attempting to reach son, no answer at phone number at this time.

## 2021-03-15 NOTE — ED Notes (Signed)
Pt remains A&Ox3, RR even and nonlabored. No changes in neuro status, no neuro deficits observed. Pt continues to deny pain. Son is at bedside. Pharmacy has been to see pt and verify home medications. POC has been updated by admitting MD.

## 2021-03-16 ENCOUNTER — Observation Stay (HOSPITAL_COMMUNITY): Payer: Medicare Other

## 2021-03-16 DIAGNOSIS — I739 Peripheral vascular disease, unspecified: Secondary | ICD-10-CM | POA: Diagnosis present

## 2021-03-16 DIAGNOSIS — Z91030 Bee allergy status: Secondary | ICD-10-CM | POA: Diagnosis not present

## 2021-03-16 DIAGNOSIS — H532 Diplopia: Secondary | ICD-10-CM | POA: Diagnosis present

## 2021-03-16 DIAGNOSIS — Z66 Do not resuscitate: Secondary | ICD-10-CM | POA: Diagnosis present

## 2021-03-16 DIAGNOSIS — C719 Malignant neoplasm of brain, unspecified: Secondary | ICD-10-CM | POA: Diagnosis present

## 2021-03-16 DIAGNOSIS — K219 Gastro-esophageal reflux disease without esophagitis: Secondary | ICD-10-CM | POA: Diagnosis present

## 2021-03-16 DIAGNOSIS — I69398 Other sequelae of cerebral infarction: Secondary | ICD-10-CM | POA: Diagnosis not present

## 2021-03-16 DIAGNOSIS — Z85828 Personal history of other malignant neoplasm of skin: Secondary | ICD-10-CM | POA: Diagnosis not present

## 2021-03-16 DIAGNOSIS — H9190 Unspecified hearing loss, unspecified ear: Secondary | ICD-10-CM | POA: Diagnosis present

## 2021-03-16 DIAGNOSIS — I10 Essential (primary) hypertension: Secondary | ICD-10-CM | POA: Diagnosis present

## 2021-03-16 DIAGNOSIS — F4320 Adjustment disorder, unspecified: Secondary | ICD-10-CM | POA: Diagnosis present

## 2021-03-16 DIAGNOSIS — Z888 Allergy status to other drugs, medicaments and biological substances status: Secondary | ICD-10-CM | POA: Diagnosis not present

## 2021-03-16 DIAGNOSIS — Z77090 Contact with and (suspected) exposure to asbestos: Secondary | ICD-10-CM | POA: Diagnosis present

## 2021-03-16 DIAGNOSIS — Z9049 Acquired absence of other specified parts of digestive tract: Secondary | ICD-10-CM | POA: Diagnosis not present

## 2021-03-16 DIAGNOSIS — Z20822 Contact with and (suspected) exposure to covid-19: Secondary | ICD-10-CM | POA: Diagnosis present

## 2021-03-16 DIAGNOSIS — J449 Chronic obstructive pulmonary disease, unspecified: Secondary | ICD-10-CM | POA: Diagnosis present

## 2021-03-16 LAB — LIPID PANEL
Cholesterol: 103 mg/dL (ref 0–200)
HDL: 33 mg/dL — ABNORMAL LOW (ref >40)
LDL Cholesterol: 51 mg/dL (ref 0–99)
Total CHOL/HDL Ratio: 3.1 RATIO
Triglycerides: 93 mg/dL (ref <150)
VLDL: 19 mg/dL (ref 0–40)

## 2021-03-16 LAB — HEMOGLOBIN A1C
Hgb A1c MFr Bld: 5.8 % — ABNORMAL HIGH (ref 4.8–5.6)
Mean Plasma Glucose: 119.76 mg/dL

## 2021-03-16 MED ORDER — HYDRALAZINE HCL 25 MG PO TABS
25.0000 mg | ORAL_TABLET | Freq: Three times a day (TID) | ORAL | Status: DC
  Administered 2021-03-16 – 2021-03-17 (×3): 25 mg via ORAL
  Filled 2021-03-16 (×3): qty 1

## 2021-03-16 MED ORDER — HYDRALAZINE HCL 20 MG/ML IJ SOLN
10.0000 mg | INTRAMUSCULAR | Status: DC | PRN
  Administered 2021-03-16: 10 mg via INTRAVENOUS
  Filled 2021-03-16: qty 1

## 2021-03-16 MED ORDER — ONDANSETRON HCL 4 MG/2ML IJ SOLN: 4.0000 mg | Freq: Four times a day (QID) | INTRAMUSCULAR | Status: DC | PRN

## 2021-03-16 MED ORDER — DIAZEPAM 2 MG PO TABS
2.0000 mg | ORAL_TABLET | Freq: Two times a day (BID) | ORAL | Status: DC | PRN
  Administered 2021-03-16 – 2021-03-17 (×2): 2 mg via ORAL
  Filled 2021-03-16 (×2): qty 1

## 2021-03-16 NOTE — Care Management Obs Status (Signed)
[facility] [place] NOTIFICATION   Patient Details  Name: Charles Middleton MRN: [id number] Date of Birth: 1925/05/05   Medicare Observation Status Notification Given:  Yes    Tommy Medal 03/16/2021, 4:10 PM

## 2021-03-16 NOTE — Plan of Care (Signed)
  Problem: Education: Goal: Knowledge of General Education information will improve Description: Including pain rating scale, medication(s)/side effects and non-pharmacologic comfort measures Outcome: Progressing   Problem: Education: Goal: Knowledge of disease or condition will improve Outcome: Progressing Goal: Knowledge of secondary prevention will improve Outcome: Progressing   Problem: Health Behavior/Discharge Planning: Goal: Ability to manage health-related needs will improve Outcome: Progressing   Problem: Self-Care: Goal: Ability to participate in self-care as condition permits will improve Outcome: Progressing Goal: Verbalization of feelings and concerns over difficulty with self-care will improve Outcome: Progressing Goal: Ability to communicate needs accurately will improve Outcome: Progressing   Problem: Nutrition: Goal: Risk of aspiration will decrease Outcome: Progressing Goal: Dietary intake will improve Outcome: Progressing   Problem: Ischemic Stroke/TIA Tissue Perfusion: Goal: Complications of ischemic stroke/TIA will be minimized Outcome: Progressing

## 2021-03-16 NOTE — Progress Notes (Signed)
OT Cancellation Note  Patient Details Name: Charles Middleton MRN: [id number] DOB: 03-24-1925   Cancelled Treatment:    Reason Eval/Treat Not Completed: Medical issues which prohibited therapy; Evaluation not completed due to nursing reporting that pt's blood pressure was not stable enough for therapy at this time. Will attempt evaluation later as time permits.  Shealyn Sean OT, MOT   Larey Seat 03/16/2021, 9:02 AM

## 2021-03-16 NOTE — Evaluation (Signed)
Speech Language Pathology Evaluation Patient Details Name: Charles Middleton MRN: [id number] DOB: 1925/05/09 Today's Date: 03/16/2021 Time: 6948-5462 SLP Time Calculation (min) (ACUTE ONLY): 29 min  Problem List:  Patient Active Problem List   Diagnosis Date Noted  . HTN (hypertension) 03/15/2021  . Transient diplopia 03/15/2021  . COPD (chronic obstructive pulmonary disease) ([place]) 03/15/2021  . Grief reaction 03/15/2021  . PAD (peripheral artery disease) ([place]) 03/15/2021  . Diplopia 03/15/2021   Past Medical History:  Past Medical History:  Diagnosis Date  . Abnormality of gait due to impairment of balance   . Asbestos exposure   . Atypical nevus 12/04/2015   mild atypia - left outer back  . Atypical nevus 05/10/2017   moderate atypia - right chest  . Basal cell carcinoma of skin 05/10/2017   Left ear rim - tx p bx  . COPD (chronic obstructive pulmonary disease) ([place])   . GERD (gastroesophageal reflux disease)   . Hearing loss   . Hypertension   . 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 Nodular basal cell carcinoma 10/20/2018   Right side of nose - tx p bx  . Squamous cell carcinoma of skin 07/07/2012   Right ear rim - tx p bx  . Squamous cell carcinoma of skin 11/12/2014   Right hand - tx p bx  . Squamous cell carcinoma of skin 10/20/2018   Right ear rim - tx p bx   Past Surgical History:  Past Surgical History:  Procedure Laterality Date  . APPENDECTOMY    . blephroplasty    . cataract sx    . CHOLECYSTECTOMY    . HERNIA REPAIR    . TONSILLECTOMY  remote   HPI:  Charles Middleton is a 87 y.o. male with medical history significant of HTN, GERD, HOH, COPD but not oxygen or steroid dependent. [facility]. His daughter reports difficulty with elevated blood pressure at home. His daughter is been checking his blood pressure. She also states he is acting sluggish.  For the last few days he has been having  intermittent double vision but not present currently.  Patient was admitted for evaluation of intermittent diplopia as well as poorly controlled hypertension.  Brain MRI revealing brain mass to right frontal region.  Neurology consulted for further evaluation.   Assessment / Plan / Recommendation Clinical Impression  Pt's cognitive linguistic abilities are at baseline (per Pt/son) and WNL for age. He is oriented to time and situation. He recalled 3 of 4 words during delayed recall task independently. He followed 2-3 step directions independently and verbally expressed complex thoughts and feelings independently. He was driving prior to admission, but has assist from family as needed. He plans to move into a mobile home next to his son for extra support (wife of 27 years recently passed away). He verbalized that he is hoping to be discharged from the hospital today. [facility] assured him that MD would be made aware, but that they may still be waiting for more tests. No further SLP services indicated at this time. SLP will sign off. Above to MD.    SLP Assessment  SLP Recommendation/Assessment: Patient does not need any further Speech Lanaguage Pathology Services SLP Visit Diagnosis: Cognitive communication deficit (R41.841)    Follow Up Recommendations  None    Frequency and Duration           SLP Evaluation Cognition  Overall Cognitive  Status: Within Functional Limits for tasks assessed Arousal/Alertness: Awake/alert Orientation Level: Oriented X4 Memory: Impaired Memory Impairment: Retrieval deficit (3 of 4 words independently) Awareness: Appears intact Problem Solving: Appears intact Safety/Judgment: Appears intact       Comprehension  Auditory Comprehension Overall Auditory Comprehension: Appears within functional limits for tasks assessed Yes/No Questions: Within Functional Limits Commands: Within Functional Limits Conversation: Complex Visual  Recognition/Discrimination Discrimination: Within Function Limits Reading Comprehension Reading Status: Not tested    Expression Expression Primary Mode of Expression: Verbal Verbal Expression Overall Verbal Expression: Appears within functional limits for tasks assessed Initiation: No impairment Automatic Speech: Name;Social Response Level of Generative/Spontaneous Verbalization: Conversation Repetition: No impairment Naming: No impairment Pragmatics: No impairment Non-Verbal Means of Communication: Not applicable Written Expression Dominant Hand: Right Written Expression: Not tested   Oral / Motor  Oral Motor/Sensory Function Overall Oral Motor/Sensory Function: Within functional limits Motor Speech Overall Motor Speech: Appears within functional limits for tasks assessed Respiration: Within functional limits Phonation: Normal Resonance: Within functional limits Articulation: Within functional limitis Intelligibility: Intelligible Motor Planning: Witnin functional limits Motor Speech Errors: Not applicable   Thank you,  Genene Churn, [place]                     Rajesh Wyss 03/16/2021, 3:09 PM

## 2021-03-16 NOTE — Plan of Care (Signed)
  Problem: Education: Goal: Knowledge of General Education information will improve Description Including pain rating scale, medication(s)/side effects and non-pharmacologic comfort measures Outcome: Progressing   

## 2021-03-16 NOTE — Evaluation (Signed)
Physical Therapy Evaluation Patient Details Name: Charles Middleton MRN: [id number] DOB: 01-09-1925 Today's Date: 03/16/2021   History of Present Illness  Charles Middleton is a 87 y.o. male with medical history significant of HTN, GERD, HOH, COPD but not oxygen or steroid dependent. [facility] was [facility] for generalized weakness. Evaluation was unremarkable. Covid testing was negative. He is evidently recently widowed after a long marriage. His daughter reports difficulty with elevated blood pressure at home. His daughter is been checking his blood pressure. She also states he is acting sluggish.  For the last few days he has been having intermittent double vision but not present currently.  States that systolic blood pressures have been in the 200s.  Patient has been taking his hypertensive meds. Patient denies any headache or any speech problems or any motor weakness.    Clinical Impression  Patient functioning near baseline for functional mobility and gait demonstrating good return for bed mobility, has mild difficulty with sit to stands from bedside due to BLE weakness, once standing able to ambulate without use of AD without having to lean on nearby objects for support, good return for transferring to/from commode in bathroom using grab bar and tolerated sitting up in chair after therapy.  Patient will benefit from continued physical therapy in hospital and recommended venue below to increase strength, balance, endurance for safe ADLs and gait.     Follow Up Recommendations Home health PT;Supervision for mobility/OOB;Supervision - Intermittent    Equipment Recommendations  None recommended by PT    Recommendations for Other Services       Precautions / Restrictions Precautions Precautions: Fall Restrictions Weight Bearing Restrictions: No      Mobility  Bed Mobility Overal bed mobility: Modified Independent             General bed mobility comments: slightly increased time,  labored movement    Transfers Overall transfer level: Needs assistance Equipment used: 1 person hand held assist;None Transfers: Sit to/from [facility] Sit to Stand: Supervision;Min guard Stand pivot transfers: Supervision       General transfer comment: increased time, mild difficulty for completing sit to stands from bedside due to BLE weakness, good return for transferring to commode in bathroom using grab bar  Ambulation/Gait Ambulation/Gait assistance: Supervision Gait Distance (Feet): 45 Feet Assistive device: None Gait Pattern/deviations: Decreased step length - right;Decreased step length - left;Decreased stride length Gait velocity: decreased   General Gait Details: slightly labored cadence with loss of balance, limited mostly due to c/o fatigue  Stairs            Wheelchair Mobility    Modified Rankin (Stroke Patients Only)       Balance Overall balance assessment: Mild deficits observed, not formally tested                                           Pertinent Vitals/Pain Pain Assessment: No/denies pain    Home Living Family/patient expects to be discharged to:: Private residence Living Arrangements: Alone Available Help at Discharge: Family;Available 24 hours/day Type of Home: House Home Access: Level entry     Home Layout: Two level Home Equipment: Shower seat      Prior Function Level of Independence: Independent         Comments: Hydrographic surveyor, drives     Hand Dominance   Dominant Hand: Right  Extremity/Trunk Assessment   Upper Extremity Assessment Upper Extremity Assessment: Defer to OT evaluation    Lower Extremity Assessment Lower Extremity Assessment: Generalized weakness    Cervical / Trunk Assessment Cervical / Trunk Assessment: Kyphotic  Communication   Communication: No difficulties  Cognition Arousal/Alertness: Awake/alert Behavior During Therapy: WFL for tasks  assessed/performed Overall Cognitive Status: Within Functional Limits for tasks assessed                                        General Comments      Exercises     Assessment/Plan    PT Assessment Patient needs continued PT services  PT Problem List Decreased strength;Decreased activity tolerance;Decreased balance;Decreased mobility       PT Treatment Interventions DME instruction;Gait training;Stair training;Functional mobility training;Therapeutic activities;Therapeutic exercise;Patient/family education;Balance training    PT Goals (Current goals can be found in the Care Plan section)  Acute Rehab PT Goals Patient Stated Goal: return home with family to assist PT Goal Formulation: With patient Time For Goal Achievement: 03/19/21 Potential to Achieve Goals: Good    Frequency Min 3X/week   Barriers to discharge        Co-evaluation               AM-PAC PT "6 Clicks" Mobility  Outcome Measure Help needed turning from your back to your side while in a flat bed without using bedrails?: None Help needed moving from lying on your back to sitting on the side of a flat bed without using bedrails?: None Help needed moving to and from a bed to a chair (including a wheelchair)?: None Help needed standing up from a chair using your arms (e.g., wheelchair or bedside chair)?: A Little Help needed to walk in hospital room?: A Little Help needed climbing 3-5 steps with a railing? : A Lot 6 Click Score: 20    End of Session   Activity Tolerance: Patient tolerated treatment well;Patient limited by fatigue Patient left: in chair;with call bell/phone within reach;with chair alarm set Nurse Communication: Mobility status PT Visit Diagnosis: Unsteadiness on feet (R26.81);Other abnormalities of gait and mobility (R26.89);Muscle weakness (generalized) (M62.81)    Time: 3710-6269 PT Time Calculation (min) (ACUTE ONLY): 29 min   Charges:   PT Evaluation $PT Eval  Moderate Complexity: 1 Mod PT Treatments $Therapeutic Activity: 23-37 mins        3:52 PM, 03/16/21 Lonell Grandchild, MPT Physical Therapist with [facility] [phone] office [phone] mobile phone

## 2021-03-16 NOTE — Plan of Care (Signed)
  Problem: Acute Rehab PT Goals(only PT should resolve) Goal: Pt Will Go Supine/Side To Sit Outcome: Progressing Flowsheets (Taken 03/16/2021 1553) Pt will go Supine/Side to Sit:  Independently  with modified independence Goal: Patient Will Transfer Sit To/From Stand Outcome: Progressing Flowsheets (Taken 03/16/2021 1553) Patient will transfer sit to/from stand:  with modified independence  with supervision Goal: Pt Will Transfer Bed To Chair/Chair To Bed Outcome: Progressing Flowsheets (Taken 03/16/2021 1553) Pt will Transfer Bed to Chair/Chair to Bed:  with modified independence  with supervision Goal: Pt Will Ambulate Outcome: Progressing Flowsheets (Taken 03/16/2021 1553) Pt will Ambulate:  75 feet  with modified independence  with supervision  with least restrictive assistive device   3:54 PM, 03/16/21 Lonell Grandchild, MPT Physical Therapist with [facility] [phone] office [phone] mobile phone

## 2021-03-16 NOTE — Progress Notes (Signed)
PROGRESS NOTE    Charles Middleton  BWG:[id number] DOB: 1925/05/12 DOA: 03/15/2021 PCP: [name], MD   Brief Narrative:   Charles Middleton is a 87 y.o. male with medical history significant of HTN, GERD, HOH, COPD but not oxygen or steroid dependent. [facility]. His daughter reportsdifficulty with elevated blood pressure at home. His daughter is been checking his blood pressure. She also states he is acting sluggish. For the last few days he has been having intermittent double vision but not present currently.  Patient was admitted for evaluation of intermittent diplopia as well as poorly controlled hypertension.  Brain MRI revealing brain mass to right frontal region.  Neurology consulted for further evaluation.  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 -Appreciate neurology evaluation -PT/OT evaluation  Poorly controlled hypertension -Continue current blood pressure medications -Hydralazine 25 mg every 8 hours added for better blood pressure control -IV hydralazine for significant elevations  History of COPD -No active symptoms currently noted -DuoNebs to be ordered as needed  PAD -Follow-up outpatient  Active grief -Recently lost wife for 7 years -Chaplain consulted   DVT prophylaxis: Lovenox Code Status: DNR Family Communication: Tried calling son multiple times with no response 3/28 Disposition Plan:  Status is: Observation  The patient will require care spanning > 2 midnights and should be moved to inpatient because: Hemodynamically unstable, Ongoing diagnostic testing needed not appropriate for outpatient work up and Inpatient level of care appropriate due  to severity of illness  Dispo: The patient is from: Home              Anticipated d/c is to: Home              Patient currently is not medically stable to d/c.   Difficult to place patient No   Consultants:   Neurology  Procedures:   See below  Antimicrobials:   None   Subjective: Patient seen and evaluated today with no new acute complaints or concerns. No acute concerns or events noted overnight.  He denies any significant weakness or double vision.  He continues to have elevated blood pressure readings.  Objective: Vitals:   03/16/21 0718 03/16/21 0800 03/16/21 0925 03/16/21 1100  BP:  (!) 178/74 (!) 174/91 (!) 231/89  Pulse:  65 71 (!) 58  Resp:   18 16  Temp:   97.9 F (36.6 C) 97.7 F (36.5 C)  TempSrc:    Oral  SpO2: 94% 95% 94% 97%  Weight:      Height:        Intake/Output Summary (Last 24 hours) at 03/16/2021 1322 Last data filed at 03/16/2021 0606 Gross per 24 hour  Intake 426.62 ml  Output 600 ml  Net -173.38 ml   Filed Weights   03/15/21 1409  Weight: 88.5 kg    Examination:  General exam: Appears calm and comfortable  Respiratory system: Clear to auscultation. Respiratory effort normal. Cardiovascular system: S1 & S2 heard, RRR.  Gastrointestinal system: Abdomen is soft Central nervous system: Alert and awake Extremities: No edema Skin: No significant lesions noted Psychiatry: Flat affect.    Data Reviewed: I have personally reviewed following labs and imaging studies  CBC: Recent Labs  Lab 03/15/21 1440 03/15/21 1503  WBC 9.2  --   NEUTROABS 4.5  --   HGB 14.8 15.6  HCT 47.9 46.0  MCV 97.2  --   PLT 136*  --    Basic Metabolic Panel: Recent Labs  Lab 03/15/21 1440 03/15/21 1503  NA 141 144  K 3.4* 3.4*  CL 100 99  CO2 32  --   GLUCOSE 117* 109*  BUN 17 20  CREATININE 1.07 1.10  CALCIUM 8.8*  --    GFR: Estimated Creatinine Clearance: 45.1 mL/min (by C-G formula based on SCr of 1.1 mg/dL). Liver Function  Tests: Recent Labs  Lab 03/15/21 1440  AST 27  ALT 32  ALKPHOS 72  BILITOT 0.6  PROT 6.5  ALBUMIN 3.6   No results for input(s): LIPASE, AMYLASE in the last 168 hours. No results for input(s): AMMONIA in the last 168 hours. Coagulation Profile: Recent Labs  Lab 03/15/21 1440  INR 1.0   Cardiac Enzymes: No results for input(s): CKTOTAL, CKMB, CKMBINDEX, TROPONINI in the last 168 hours. BNP (last 3 results) No results for input(s): PROBNP in the last 8760 hours. HbA1C: Recent Labs    03/16/21 0537  HGBA1C 5.8*   CBG: No results for input(s): GLUCAP in the last 168 hours. Lipid Profile: Recent Labs    03/16/21 0537  CHOL 103  HDL 33*  LDLCALC 51  TRIG 93  CHOLHDL 3.1   Thyroid Function Tests: No results for input(s): TSH, T4TOTAL, FREET4, T3FREE, THYROIDAB in the last 72 hours. Anemia Panel: No results for input(s): VITAMINB12, FOLATE, FERRITIN, TIBC, IRON, RETICCTPCT in the last 72 hours. Sepsis Labs: No results for input(s): PROCALCITON, LATICACIDVEN in the last 168 hours.  Recent Results (from the past 240 hour(s))  Resp Panel by RT-PCR (Flu A&B, Covid) Nasopharyngeal Swab     Status: None   Collection Time: 03/15/21  4:06 PM   Specimen: Nasopharyngeal Swab; Nasopharyngeal(NP) swabs in vial transport medium  Result Value Ref Range Status   SARS Coronavirus 2 by RT PCR NEGATIVE NEGATIVE Final    Comment: (NOTE) SARS-CoV-2 target nucleic acids are NOT DETECTED.  The SARS-CoV-2 RNA is generally detectable in upper respiratory specimens during the acute phase of infection. The lowest concentration of SARS-CoV-2 viral copies this assay can detect is 138 copies/mL. A negative result does not preclude SARS-Cov-2 infection and should not be used as the sole basis for treatment or other patient management decisions. A negative result may occur with  improper specimen collection/handling, submission of specimen other than nasopharyngeal swab, presence of viral  mutation(s) within the areas targeted by this assay, and inadequate number of viral copies(<138 copies/mL). A negative result must be combined with clinical observations, patient history, and epidemiological information. The expected result is Negative.  Fact Sheet for Patients:  EntrepreneurPulse.com.au  Fact Sheet for Healthcare Providers:  IncredibleEmployment.be  This test is no t yet approved or cleared by the [place] FDA and  has been authorized for detection and/or diagnosis of SARS-CoV-2 by FDA under an Emergency Use Authorization (EUA). This EUA will remain  in effect (meaning this test can be used) for the duration of the COVID-19 declaration under Section 564(b)(1) of the Act, 21 U.S.C.section 360bbb-3(b)(1), unless the authorization is terminated  or revoked sooner.       Influenza A by PCR NEGATIVE NEGATIVE Final   Influenza B by PCR NEGATIVE NEGATIVE Final    Comment: (NOTE) The Xpert Xpress SARS-CoV-2/FLU/RSV plus assay is  intended as an aid in the diagnosis of influenza from Nasopharyngeal swab specimens and should not be used as a sole basis for treatment. Nasal washings and aspirates are unacceptable for Xpert Xpress SARS-CoV-2/FLU/RSV testing.  Fact Sheet for Patients: EntrepreneurPulse.com.au  Fact Sheet for Healthcare Providers: IncredibleEmployment.be  This test is not yet approved or cleared by the [place] FDA and has been authorized for detection and/or diagnosis of SARS-CoV-2 by FDA under an Emergency Use Authorization (EUA). This EUA will remain in effect (meaning this test can be used) for the duration of the COVID-19 declaration under Section 564(b)(1) of the Act, 21 U.S.C. section 360bbb-3(b)(1), unless the authorization is terminated or revoked.  Performed at [facility], [address] [place], [place] [address]          Radiology Studies: CT Head Wo  Contrast  Result Date: 03/15/2021 CLINICAL DATA:  Dizziness. Hypertension and double vision for 2 days. EXAM: CT HEAD WITHOUT CONTRAST TECHNIQUE: Contiguous axial images were obtained from the base of the skull through the vertex without intravenous contrast. COMPARISON:  None. FINDINGS: Brain: No evidence of acute infarction, hemorrhage, hydrocephalus, extra-axial collection or mass lesion/mass effect. There is mild diffuse low-attenuation within the subcortical and periventricular white matter compatible with chronic microvascular disease. Prominence of sulci and ventricles compatible with brain atrophy Vascular: No hyperdense vessel or unexpected calcification. Skull: Normal. Negative for fracture or focal lesion. Sinuses/Orbits: Mild right maxillary sinus mucosal thickening. The remaining paranasal sinuses are clear. Normal appearance of the right maxillary sinus. Signs of previous left mastoidectomy identified. Other: None. IMPRESSION: 1. No acute intracranial abnormalities. 2. Chronic small vessel ischemic disease and brain atrophy. Electronically Signed   By: Kerby Moors M.D.   On: 03/15/2021 15:49   MR BRAIN WO CONTRAST  Result Date: 03/16/2021 CLINICAL DATA:  Transient ischemic attack (TIA). Additional history provided: Bilateral weakness for 2 days. EXAM: MRI HEAD WITHOUT CONTRAST TECHNIQUE: Multiplanar, multiecho pulse sequences of the brain and surrounding structures were obtained without intravenous contrast. COMPARISON:  Prior head CT 03/15/2021. FINDINGS: Brain: Mild intermittent motion degradation. Mild-to-moderate cerebral atrophy. Chronic small-vessel infarct within the left corona radiata/basal ganglia. Background mild multifocal T2/FLAIR hyperintensity within the cerebral white matter is nonspecific, but compatible with chronic small vessel ischemic disease. Additionally, there is an 11 mm T2/FLAIR hyperintense and T1 hypointense lesion within the inferomedial right frontal lobe,  immediately anterior and inferior to the anterior commissure (for instance as seen on series 20, images 13-15) (series 18, image 9) (series 21, image 27) (series 22, image 19). The lesion has a slightly expansile appearance. There is no acute infarct. No chronic intracranial blood products. No extra-axial fluid collection. No midline shift. Vascular: Expected proximal arterial flow voids. Skull and upper cervical spine: No focal marrow lesion. Incompletely assessed upper cervical spondylosis. C3-C4 grade 1 retrolisthesis. Sinuses/Orbits: Visualized orbits show no acute finding. Paranasal sinus mucosal thickening. Most notably, moderate mucosal thickening is present within the left ethmoid and right maxillary sinuses. Impression #3 will be called to the ordering clinician or representative by the Radiologist Assistant, and communication documented in the PACS or [employer]. IMPRESSION: Mildly motion degraded exam. No evidence of acute infarction. 11 mm parenchymal lesion within the inferomedial right frontal lobe, immediately anterior and inferior to the anterior commissure as described. While this lesion is indeterminate in etiology, it has a subtly expansile appearance and a primary CNS neoplasm such as glioma cannot be excluded. Contrast-enhanced MR imaging of the brain is recommended for further characterization. Additionally,  MRI surveillance is recommended. 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 Background mild cerebral white matter chronic small vessel ischemic disease. Mild-to-moderate cerebral atrophy. Electronically Signed   By: Kellie Simmering DO   On: 03/16/2021 12:01   DG Chest Portable 1 View  Result Date: 03/15/2021 CLINICAL DATA:  Hypertension. Patient reports double vision for 2 days. EXAM: PORTABLE CHEST 1 VIEW COMPARISON:  None. FINDINGS: Mild cardiomegaly. Aortic atherosclerosis. No pulmonary edema. Subsegmental opacities at the left lung base may be  atelectasis or scarring. No confluent consolidation. No pneumothorax or significant pleural effusion. No acute osseous abnormalities are seen. There is colonic interposition under the right hemidiaphragm. IMPRESSION: 1. Mild cardiomegaly without congestive failure. 2. Subsegmental opacities at the left lung base may be atelectasis or scarring. Electronically Signed   By: Keith Rake M.D.   On: 03/15/2021 15:51        Scheduled Meds: .  stroke: mapping our early stages of recovery book   Does not apply Once  . ascorbic acid  1,000 mg Oral Daily  . aspirin EC  81 mg Oral Daily  . [START ON 03/17/2021] digoxin  0.125 mg Oral QODAY  . enoxaparin (LOVENOX) injection  40 mg Subcutaneous Q24H  . feeding supplement  237 mL Oral BID BM  . hydrALAZINE  25 mg Oral Q8H  . hydrochlorothiazide  12.5 mg Oral Daily  . irbesartan  150 mg Oral Daily  . metoprolol succinate  75 mg Oral Daily  . potassium chloride SA  20 mEq Oral Daily  . rosuvastatin  40 mg Oral QHS  . senna-docusate  1 tablet Oral QHS  . umeclidinium bromide  1 puff Inhalation Daily    LOS: 0 days    Time spent: 35 minutes    Stylianos Stradling Darleen Crocker, DO [facility]  If 7PM-7AM, please contact night-coverage [email or web address] 03/16/2021, 1:22 PM

## 2021-03-16 NOTE — Consult Note (Signed)
[place] A. Merlene Laughter, MD     [email or web address]          Charles Middleton is an 87 y.o. male.   ASSESSMENT/PLAN: 1.  Global fatigue and weakness likely multifactorial without any clear discrete neurological problem.  2. Remote Moderate size left basal ganglia infarct extending to the sub insular region most consistent with moderate size infarct.  Aspirin 81 mg is recommended for secondary stroke prevention. 3. Left caudate lesion statistically most likely also remote infarct but surveillance is recommended. Repeat imaging in 3 months with contrast.  4. Intermittent brief  diplopia of unclear etiology. However given history of thyroid disease, repeat thyroid function tests is advised to evaluate for possible thyroid ophthalmopathy /thyroid eye disease.  Ocular  Myasthenia gravis is a possibility but unlikely.     This is a 87 year old white male who presents with fatigue, elevated blood pressure, ankle edema and intermittent diplopia. The patient's wife died about 2 weeks ago. He has been living by himself ever since. He is very active at baseline. The history is obtained from the patient and his daughter who is in the  room today. The patient has had overall down turn in his ability to function last 3 years but this has been gradual. However, over the past 2-3 weeks he has had significant fatigue. The intermittent diplopia has been there for a while probably over a month or more. The patient reports that it happens when he is focusing on an object and then takes his eye often look some rales the double vision occurs for a couple of seconds.  They indicated the patient has improved significantly since he has been hospitalized.  Patient otherwise seems to function well at home for his current illness. He is very active physically and mentally. The review systems otherwise negative.   GENERAL:  Patient appears about 10 years younger than the stated age.   HEENT:  Neck is supple  no trauma noted.  ABDOMEN: soft  EXTREMITIES: No edema; significant arthritic changes noted in the hands and legs.   BACK:  Normal  SKIN: Normal by inspection.    MENTAL STATUS: Alert and oriented. Speech, language and cognition are generally intact. Judgment and insight normal.   CRANIAL NERVES: Pupils are equal, round and reactive to light and accomodation; extra ocular movements are full, there is no significant nystagmus; visual fields are full; upper and lower facial muscles are normal in strength and symmetric, there is no flattening of the nasolabial folds; tongue is midline; uvula is midline; shoulder elevation is normal.  MOTOR: Normal tone, bulk and strength; no pronator drift.  COORDINATION: Left finger to nose is normal, right finger to nose is normal, No rest tremor; no intention tremor; no postural tremor; no bradykinesia.  REFLEXES: Deep tendon reflexes are symmetrical and normal.  SENSATION: Normal to light touch, temperature, and pain.  GAIT:  Stands with little assistance and gait is slightly unsteady.      Blood pressure (!) 155/64, pulse (!) 58, temperature 97.7 F (36.5 C), temperature source Oral, resp. rate 16, height 6' (1.829 m), weight 88.5 kg, SpO2 97 %.  Past Medical History:  Diagnosis Date  . Abnormality of gait due to impairment of balance   . Asbestos exposure   . Atypical nevus 12/04/2015   mild atypia - left outer back  . Atypical nevus 05/10/2017   moderate atypia - right chest  . Basal cell carcinoma of skin 05/10/2017   Left ear rim - tx  p bx  . COPD (chronic obstructive pulmonary disease) ([place])   . GERD (gastroesophageal reflux disease)   . Hearing loss   . Hypertension   . 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 Nodular basal cell carcinoma 10/20/2018   Right side of nose - tx p bx  . Squamous cell carcinoma of skin 07/07/2012   Right ear rim - tx p bx  . Squamous cell carcinoma of skin 11/12/2014    Right hand - tx p bx  . Squamous cell carcinoma of skin 10/20/2018   Right ear rim - tx p bx    Past Surgical History:  Procedure Laterality Date  . APPENDECTOMY    . blephroplasty    . cataract sx    . CHOLECYSTECTOMY    . HERNIA REPAIR    . TONSILLECTOMY  remote    History reviewed. No pertinent family history.  Social History:  reports that he has never smoked. He has never used smokeless tobacco. He reports that he does not drink alcohol and does not use drugs.  Allergies:  Allergies  Allergen Reactions  . Amlodipine Other (See Comments)    Swelling in feet and legs  . Bee Venom Other (See Comments)    Vision changes    Medications: Prior to Admission medications   Medication Sig Start Date End Date Taking? Authorizing Provider  Ascorbic Acid (VITAMIN C) 500 MG CAPS Take 1,000 mg by mouth daily.  11/22/11  Yes [provider]  digoxin (LANOXIN) 0.125 MG tablet Take 0.125 mg by mouth every other day. 01/16/17  Yes [provider]  hydrochlorothiazide (MICROZIDE) 12.5 MG capsule Take 12.5 mg by mouth daily. 02/02/21  Yes [provider]  irbesartan (AVAPRO) 150 MG tablet Take 150 mg by mouth daily. 02/21/21  Yes [provider]  Multiple Vitamins-Minerals (COMPLETE) TABS Take 1 tablet by mouth daily.  11/22/11  Yes [provider]  Omega-3 Fatty Acids (FISH OIL PO) Take 1 capsule by mouth daily.  11/22/11  Yes [provider]  potassium chloride SA (KLOR-CON) 20 MEQ tablet Take 20 mEq by mouth daily. 01/23/21  Yes [provider]  rosuvastatin (CRESTOR) 40 MG tablet Take 40 mg by mouth at bedtime.   Yes [provider]  TOPROL XL 50 MG 24 hr tablet Take 75 mg by mouth daily.  11/22/16  Yes [provider]  TUDORZA PRESSAIR 400 MCG/ACT AEPB Inhale 1 Inhaler into the lungs daily.  10/27/16  Yes [provider]  ibuprofen (ADVIL,MOTRIN) 600 MG tablet Take 1 tablet (600 mg total) by mouth every 6  (six) hours as needed. 01/21/17   Noemi Chapel, MD  methocarbamol (ROBAXIN) 500 MG tablet Take 1 tablet (500 mg total) by mouth 2 (two) times daily as needed for muscle spasms. 01/21/17   Noemi Chapel, MD    Scheduled Meds: .  stroke: mapping our early stages of recovery book   Does not apply Once  . ascorbic acid  1,000 mg Oral Daily  . aspirin EC  81 mg Oral Daily  . [START ON 03/17/2021] digoxin  0.125 mg Oral QODAY  . enoxaparin (LOVENOX) injection  40 mg Subcutaneous Q24H  . feeding supplement  237 mL Oral BID BM  . hydrALAZINE  25 mg Oral Q8H  . hydrochlorothiazide  12.5 mg Oral Daily  . irbesartan  150 mg Oral Daily  . metoprolol succinate  75 mg Oral Daily  . potassium  chloride SA  20 mEq Oral Daily  . rosuvastatin  40 mg Oral QHS  . senna-docusate  1 tablet Oral QHS  . umeclidinium bromide  1 puff Inhalation Daily   Continuous Infusions: PRN Meds:.acetaminophen **OR** acetaminophen (TYLENOL) oral liquid 160 mg/5 mL **OR** acetaminophen, diazepam, hydrALAZINE, ibuprofen, methocarbamol, ondansetron (ZOFRAN) IV     Results for orders placed or performed during the hospital encounter of 03/15/21 (from the past 48 hour(s))  CBC with Differential     Status: Abnormal   Collection Time: 03/15/21  2:40 PM  Result Value Ref Range   WBC 9.2 4.0 - 10.5 K/uL   RBC 4.93 4.22 - 5.81 MIL/uL   Hemoglobin 14.8 13.0 - 17.0 g/dL   HCT 47.9 39.0 - 52.0 %   MCV 97.2 80.0 - 100.0 fL   MCH 30.0 26.0 - 34.0 pg   MCHC 30.9 30.0 - 36.0 g/dL   RDW 12.5 11.5 - 15.5 %   Platelets 136 (L) 150 - 400 K/uL   nRBC 0.0 0.0 - 0.2 %   Neutrophils Relative % 49 %   Neutro Abs 4.5 1.7 - 7.7 K/uL   Lymphocytes Relative 39 %   Lymphs Abs 3.6 0.7 - 4.0 K/uL   Monocytes Relative 7 %   Monocytes Absolute 0.7 0.1 - 1.0 K/uL   Eosinophils Relative 5 %   Eosinophils Absolute 0.5 0.0 - 0.5 K/uL   Basophils Relative 0 %   Basophils Absolute 0.0 0.0 - 0.1 K/uL   Immature Granulocytes 0 %   Abs Immature  Granulocytes 0.03 0.00 - 0.07 K/uL    Comment: Performed at [facility], [address], [place], [place] [address]  Basic metabolic panel     Status: Abnormal   Collection Time: 03/15/21  2:40 PM  Result Value Ref Range   Sodium 141 135 - 145 mmol/L   Potassium 3.4 (L) 3.5 - 5.1 mmol/L   Chloride 100 98 - 111 mmol/L   CO2 32 22 - 32 mmol/L   Glucose, Bld 117 (H) 70 - 99 mg/dL    Comment: Glucose reference range applies only to samples taken after fasting for at least 8 hours.   BUN 17 8 - 23 mg/dL   Creatinine, Ser 1.07 0.61 - 1.24 mg/dL   Calcium 8.8 (L) 8.9 - 10.3 mg/dL   GFR, Estimated >60 >60 mL/min    Comment: (NOTE) Calculated using the CKD-EPI Creatinine Equation (2021)    Anion gap 9 5 - 15    Comment: Performed at [facility], [address] [place], [place] [address]  Troponin I (High Sensitivity)     Status: None   Collection Time: 03/15/21  2:40 PM  Result Value Ref Range   Troponin I (High Sensitivity) 9 <18 ng/L    Comment: (NOTE) Elevated high sensitivity troponin I (hsTnI) values and significant  changes across serial measurements may suggest ACS but many other  chronic and acute conditions are known to elevate hsTnI results.  Refer to the "Links" section for chest pain algorithms and additional  guidance. Performed at [facility], [address] [place], [place] [address]   APTT     Status: None   Collection Time: 03/15/21  2:40 PM  Result Value Ref Range   aPTT 30 24 - 36 seconds    Comment: Performed at [facility], [address], [place], [place] [address]  Protime-INR     Status: None   Collection Time: 03/15/21  2:40 PM  Result Value  Ref Range   Prothrombin Time 12.4 11.4 - 15.2 seconds   INR 1.0 0.8 - 1.2    Comment: (NOTE) INR goal varies based on device and disease states. Performed at [facility], [address], [place], [place] [address]   Ethanol     Status: None   Collection Time: 03/15/21  2:40 PM  Result Value Ref Range   Alcohol,  Ethyl (B) <10 <10 mg/dL    Comment: (NOTE) Lowest detectable limit for serum alcohol is 10 mg/dL.  For medical purposes only. Performed at [facility], [address], [place], [place] [address]   Hepatic function panel     Status: None   Collection Time: 03/15/21  2:40 PM  Result Value Ref Range   Total Protein 6.5 6.5 - 8.1 g/dL   Albumin 3.6 3.5 - 5.0 g/dL   AST 27 15 - 41 U/L   ALT 32 0 - 44 U/L   Alkaline Phosphatase 72 38 - 126 U/L   Total Bilirubin 0.6 0.3 - 1.2 mg/dL   Bilirubin, Direct 0.1 0.0 - 0.2 mg/dL   Indirect Bilirubin 0.5 0.3 - 0.9 mg/dL    Comment: Performed at [facility], [address] [place], [place] [address]  I-stat chem 8, ED     Status: Abnormal   Collection Time: 03/15/21  3:03 PM  Result Value Ref Range   Sodium 144 135 - 145 mmol/L   Potassium 3.4 (L) 3.5 - 5.1 mmol/L   Chloride 99 98 - 111 mmol/L   BUN 20 8 - 23 mg/dL   Creatinine, Ser 1.10 0.61 - 1.24 mg/dL   Glucose, Bld 109 (H) 70 - 99 mg/dL    Comment: Glucose reference range applies only to samples taken after fasting for at least 8 hours.   Calcium, Ion 1.18 1.15 - 1.40 mmol/L   TCO2 35 (H) 22 - 32 mmol/L   Hemoglobin 15.6 13.0 - 17.0 g/dL   HCT 46.0 39.0 - 52.0 %  Resp Panel by RT-PCR (Flu A&B, Covid) Nasopharyngeal Swab     Status: None   Collection Time: 03/15/21  4:06 PM   Specimen: Nasopharyngeal Swab; Nasopharyngeal(NP) swabs in vial transport medium  Result Value Ref Range   SARS Coronavirus 2 by RT PCR NEGATIVE NEGATIVE    Comment: (NOTE) SARS-CoV-2 target nucleic acids are NOT DETECTED.  The SARS-CoV-2 RNA is generally detectable in upper respiratory specimens during the acute phase of infection. The lowest concentration of SARS-CoV-2 viral copies this assay can detect is 138 copies/mL. A negative result does not preclude SARS-Cov-2 infection and should not be used as the sole basis for treatment or other patient management decisions. A negative result may occur with   improper specimen collection/handling, submission of specimen other than nasopharyngeal swab, presence of viral mutation(s) within the areas targeted by this assay, and inadequate number of viral copies(<138 copies/mL). A negative result must be combined with clinical observations, patient history, and epidemiological information. The expected result is Negative.  Fact Sheet for Patients:  EntrepreneurPulse.com.au  Fact Sheet for Healthcare Providers:  IncredibleEmployment.be  This test is no t yet approved or cleared by the [place] FDA and  has been authorized for detection and/or diagnosis of SARS-CoV-2 by FDA under an Emergency Use Authorization (EUA). This EUA will remain  in effect (meaning this test can be used) for the duration of the COVID-19 declaration under Section 564(b)(1) of the Act, 21 U.S.C.section 360bbb-3(b)(1), unless the authorization is terminated  or revoked sooner.  Influenza A by PCR NEGATIVE NEGATIVE   Influenza B by PCR NEGATIVE NEGATIVE    Comment: (NOTE) The Xpert Xpress SARS-CoV-2/FLU/RSV plus assay is intended as an aid in the diagnosis of influenza from Nasopharyngeal swab specimens and should not be used as a sole basis for treatment. Nasal washings and aspirates are unacceptable for Xpert Xpress SARS-CoV-2/FLU/RSV testing.  Fact Sheet for Patients: EntrepreneurPulse.com.au  Fact Sheet for Healthcare Providers: IncredibleEmployment.be  This test is not yet approved or cleared by the [place] FDA and has been authorized for detection and/or diagnosis of SARS-CoV-2 by FDA under an Emergency Use Authorization (EUA). This EUA will remain in effect (meaning this test can be used) for the duration of the COVID-19 declaration under Section 564(b)(1) of the Act, 21 U.S.C. section 360bbb-3(b)(1), unless the authorization is terminated or revoked.  Performed at  [facility], [address], [place], [place] [address]   Troponin I (High Sensitivity)     Status: None   Collection Time: 03/15/21  4:29 PM  Result Value Ref Range   Troponin I (High Sensitivity) 9 <18 ng/L    Comment: (NOTE) Elevated high sensitivity troponin I (hsTnI) values and significant  changes across serial measurements may suggest ACS but many other  chronic and acute conditions are known to elevate hsTnI results.  Refer to the "Links" section for chest pain algorithms and additional  guidance. Performed at [facility], [address], [place], [place] [address]   Urinalysis, Routine w reflex microscopic Urine, Clean Catch     Status: Abnormal   Collection Time: 03/15/21  6:00 PM  Result Value Ref Range   Color, Urine YELLOW YELLOW   APPearance HAZY (A) CLEAR   Specific Gravity, Urine 1.017 1.005 - 1.030   pH 6.0 5.0 - 8.0   Glucose, UA NEGATIVE NEGATIVE mg/dL   Hgb urine dipstick SMALL (A) NEGATIVE   Bilirubin Urine NEGATIVE NEGATIVE   Ketones, ur NEGATIVE NEGATIVE mg/dL   Protein, ur NEGATIVE NEGATIVE mg/dL   Nitrite NEGATIVE NEGATIVE   Leukocytes,Ua LARGE (A) NEGATIVE   RBC / HPF 6-10 0 - 5 RBC/hpf   WBC, UA >50 (H) 0 - 5 WBC/hpf   Bacteria, UA NONE SEEN NONE SEEN   Squamous Epithelial / LPF 0-5 0 - 5   Mucus PRESENT     Comment: Performed at [facility], [address] [place],  [address]  Urine rapid drug screen (hosp performed)     Status: Abnormal   Collection Time: 03/15/21  6:00 PM  Result Value Ref Range   Opiates NONE DETECTED NONE DETECTED   Cocaine NONE DETECTED NONE DETECTED   Benzodiazepines POSITIVE (A) NONE DETECTED   Amphetamines NONE DETECTED NONE DETECTED   Tetrahydrocannabinol NONE DETECTED NONE DETECTED   Barbiturates NONE DETECTED NONE DETECTED    Comment: (NOTE) DRUG SCREEN FOR MEDICAL PURPOSES ONLY.  IF CONFIRMATION IS NEEDED FOR ANY PURPOSE, NOTIFY LAB WITHIN 5 DAYS.  LOWEST DETECTABLE LIMITS FOR URINE DRUG SCREEN Drug  Class                     Cutoff (ng/mL) Amphetamine and metabolites    1000 Barbiturate and metabolites    200 Benzodiazepine                 833 Tricyclics and metabolites     300 Opiates and metabolites        300 Cocaine and metabolites        300 THC  17 Performed at [facility], [address], [place], [place] [address]   Hemoglobin A1c     Status: Abnormal   Collection Time: 03/16/21  5:37 AM  Result Value Ref Range   Hgb A1c MFr Bld 5.8 (H) 4.8 - 5.6 %    Comment: (NOTE) Pre diabetes:          5.7%-6.4%  Diabetes:              >6.4%  Glycemic control for   <7.0% adults with diabetes    Mean Plasma Glucose 119.76 mg/dL    Comment: Performed at [facility] [address], [place], [place] [address]  Lipid panel     Status: Abnormal   Collection Time: 03/16/21  5:37 AM  Result Value Ref Range   Cholesterol 103 0 - 200 mg/dL   Triglycerides 93 <150 mg/dL   HDL 33 (L) >40 mg/dL   Total CHOL/HDL Ratio 3.1 RATIO   VLDL 19 0 - 40 mg/dL   LDL Cholesterol 51 0 - 99 mg/dL    Comment:        Total Cholesterol/HDL:CHD Risk Coronary Heart Disease Risk Table                     Men   Women  1/2 Average Risk   3.4   3.3  Average Risk       5.0   4.4  2 X Average Risk   9.6   7.1  3 X Average Risk  23.4   11.0        Use the calculated Patient Ratio above and the CHD Risk Table to determine the patient's CHD Risk.        ATP III CLASSIFICATION (LDL):  <100     mg/dL   Optimal  100-129  mg/dL   Near or Above                    Optimal  130-159  mg/dL   Borderline  160-189  mg/dL   High  >190     mg/dL   Very High Performed at [facility] [place], [place] [address]     Studies/Results:   BRAIN MRI FINDINGS: Brain:  Mild intermittent motion degradation.  Mild-to-moderate cerebral atrophy.  Chronic small-vessel infarct within the left corona radiata/basal ganglia.  Background mild multifocal T2/FLAIR  hyperintensity within the cerebral white matter is nonspecific, but compatible with chronic small vessel ischemic disease.  Additionally, there is an 11 mm T2/FLAIR hyperintense and T1 hypointense lesion within the inferomedial right frontal lobe, immediately anterior and inferior to the anterior commissure (for instance as seen on series 20, images 13-15) (series 18, image 9) (series 21, image 27) (series 22, image 19). The lesion has a slightly expansile appearance.  There is no acute infarct.  No chronic intracranial blood products.  No extra-axial fluid collection.  No midline shift.  Vascular: Expected proximal arterial flow voids.  Skull and upper cervical spine: No focal marrow lesion. Incompletely assessed upper cervical spondylosis. C3-C4 grade 1 retrolisthesis.  Sinuses/Orbits: Visualized orbits show no acute finding. Paranasal sinus mucosal thickening. Most notably, moderate mucosal thickening is present within the left ethmoid and right maxillary sinuses.  Impression #3 will be called to the ordering clinician or representative by the Radiologist Assistant, and communication documented in the PACS or [employer].  IMPRESSION: Mildly motion degraded exam.  No evidence of acute infarction.  11 mm  parenchymal lesion within the inferomedial right frontal lobe, immediately anterior and inferior to the anterior commissure as described. While this lesion is indeterminate in etiology, it has a subtly expansile appearance and a primary CNS neoplasm such as glioma cannot be excluded. Contrast-enhanced MR imaging of the brain is recommended for further characterization. Additionally, MRI surveillance is recommended.  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  Background mild cerebral white matter chronic small vessel ischemic disease.  Mild-to-moderate cerebral atrophy.    The brain MRI is reviewed in person and  shows no acute findings. There is moderate periventricular confluent leukoencephalopathy consistent with microvascular ischemic changes appropriate for age. There is a moderate size is of encephalomalacia involving the left basal ganglia extending to the centrum semiovale and subinsular cortex.  This is consistent with a remote subcortical infarct. No hemorrhages noted. There is ill-defined area involving the right caudate that is reduced signal on T1 but increase on T2 most consistent with the also remote lacunar infarct.   Charles Middleton, M.D.  Diplomate, Tax adviser of Psychiatry and Neurology ( Neurology). 03/16/2021, 5:36 PM

## 2021-03-16 NOTE — Progress Notes (Signed)
Pt emotional d/t recent death of wife of 33 years. VS checked, Systolic BP continues to be elevated, prn hydralazine administered. Tylenol given for back pain with good results. Pt c/o dizziness, stated he takes diazepam for this @ home. Will continue to monitor.

## 2021-03-17 DIAGNOSIS — H532 Diplopia: Secondary | ICD-10-CM | POA: Diagnosis not present

## 2021-03-17 LAB — BASIC METABOLIC PANEL
Anion gap: 9 (ref 5–15)
BUN: 16 mg/dL (ref 8–23)
CO2: 27 mmol/L (ref 22–32)
Calcium: 9 mg/dL (ref 8.9–10.3)
Chloride: 102 mmol/L (ref 98–111)
Creatinine, Ser: 0.89 mg/dL (ref 0.61–1.24)
GFR, Estimated: >60 mL/min (ref >60)
Glucose, Bld: 106 mg/dL — ABNORMAL HIGH (ref 70–99)
Potassium: 3.3 mmol/L — ABNORMAL LOW (ref 3.5–5.1)
Sodium: 138 mmol/L (ref 135–145)

## 2021-03-17 LAB — MAGNESIUM: Magnesium: 1.9 mg/dL (ref 1.7–2.4)

## 2021-03-17 MED ORDER — ALBUTEROL SULFATE HFA 108 (90 BASE) MCG/ACT IN AERS: 2.0000 | INHALATION_SPRAY | Freq: Four times a day (QID) | RESPIRATORY_TRACT | 2 refills | Status: AC | PRN

## 2021-03-17 MED ORDER — HYDRALAZINE HCL 25 MG PO TABS: 25.0000 mg | ORAL_TABLET | Freq: Three times a day (TID) | ORAL | 2 refills | Status: AC

## 2021-03-17 MED ORDER — ASPIRIN 81 MG PO TBEC: 81.0000 mg | DELAYED_RELEASE_TABLET | Freq: Every day | ORAL | 11 refills | Status: AC

## 2021-03-17 MED ORDER — ENSURE ENLIVE PO LIQD: 237.0000 mL | Freq: Two times a day (BID) | ORAL | 12 refills | Status: AC

## 2021-03-17 MED ORDER — POTASSIUM CHLORIDE CRYS ER 20 MEQ PO TBCR
40.0000 meq | EXTENDED_RELEASE_TABLET | Freq: Once | ORAL | Status: AC
  Administered 2021-03-17: 40 meq via ORAL
  Filled 2021-03-17: qty 2

## 2021-03-17 NOTE — Evaluation (Signed)
Occupational Therapy Evaluation Patient Details Name: Charles Middleton MRN: [id number] DOB: 1925/09/18 Today's Date: 03/17/2021    History of Present Illness Charles Middleton is a 87 y.o. male with medical history significant of HTN, GERD, HOH, COPD but not oxygen or steroid dependent. He was seen 03/13/21 in [facility] for generalized weakness. Evaluation was unremarkable. Covid testing was negative. He is evidently recently widowed after a long marriage. His daughter reports difficulty with elevated blood pressure at home. His daughter is been checking his blood pressure. She also states he is acting sluggish.  For the last few days he has been having intermittent double vision but not present currently.  States that systolic blood pressures have been in the 200s.  Patient has been taking his hypertensive meds. Patient denies any headache or any speech problems or any motor weakness.   Clinical Impression   Pt was agreeable to OT evaluation this date. Pt demonstrated ability to complete functional ambulation and stand pivot transfers with SPV. One instance noted of mild LOB when pt attempted to reach to the floor to retrieve an object. Able to briefly stand (1 to 2 minutes) at sink without O2 with SpO2 level of 92%. Placed back on 2 L O2 when pt returned to sitting. Pt reported that he uses O2 at night only. Pt blood pressure taken seated at EOB at 145/59. Pt able to complete oral care at sink while standing with SPV. Pt is not recommended for continued acute OT services but would likely benefit from [facility] OT to increase functional status as much as possible in the home setting.     Follow Up Recommendations  Home health OT;Supervision - Intermittent;Other (comment) (SPV for transfers)    Equipment Recommendations  None recommended by OT           Precautions / Restrictions Precautions Precautions: Fall Restrictions Weight Bearing Restrictions: No      Mobility Bed Mobility                     Transfers Overall transfer level: Needs assistance Equipment used: None Transfers: Sit to/from Stand;Stand Pivot Transfers Sit to Stand: Supervision Stand pivot transfers: Supervision       General transfer comment: SPV from EOB to chair.    Balance Overall balance assessment: Mild deficits observed, not formally tested (Mild LOB when attempting to reach to floor level from standing.)                                         ADL either performed or assessed with clinical judgement   ADL Overall ADL's : Needs assistance/impaired     Grooming: Wash/dry hands;Oral care;Supervision/safety Grooming Details (indicate cue type and reason): Completed standing at sink. SPV to ensure safety. Mild LOB when attempting to reach to the floor to obtain a dropped piece of trash.                               General ADL Comments: Labored movement at times with reports of mild dizziness.     Vision Baseline Vision/History: No visual deficits Vision Assessment?: No apparent visual deficits                Pertinent Vitals/Pain Pain Assessment: No/denies pain     Hand Dominance Right   Extremity/Trunk Assessment Upper Extremity Assessment  Upper Extremity Assessment: Overall WFL for tasks assessed   Lower Extremity Assessment Lower Extremity Assessment: Defer to PT evaluation   Cervical / Trunk Assessment Cervical / Trunk Assessment: Kyphotic   Communication Communication Communication: No difficulties   Cognition Arousal/Alertness: Awake/alert Behavior During Therapy: WFL for tasks assessed/performed Overall Cognitive Status: Within Functional Limits for tasks assessed                                                      Home Living Family/patient expects to be discharged to:: Private residence Living Arrangements: Alone Available Help at Discharge: Family;Available 24 hours/day Type of Home: House Home Access:  Level entry     Home Layout: Two level Alternate Level Stairs-Number of Steps: 10-11 Alternate Level Stairs-Rails: Right Bathroom Shower/Tub: Tub/shower unit   Bathroom Toilet: Handicapped height Bathroom Accessibility: Yes   Home Equipment: Walker - 4 wheels;Shower seat          Prior Functioning/Environment Level of Independence: Independent        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 Able to go without for a brief time in stading with level around 92%)  Activity Tolerance: Patient tolerated treatment well Patient left: with call bell/phone within reach;with chair alarm set;in chair  OT Visit Diagnosis: Unsteadiness on feet (R26.81);Muscle weakness (generalized) (M62.81)                Time: 8676-7209 OT Time Calculation (min): 22 min Charges:  OT General Charges $OT Visit: 1 Visit OT Evaluation $OT Eval Low Complexity: 1 Low  Anjeli Casad OT, MOT  Larey Seat 03/17/2021, 10:48 AM

## 2021-03-17 NOTE — Progress Notes (Signed)
Alert and oriented x 4 with some confusion to time. Denies [place] or discomfort. Frequent bathroom use. Ambulates in room with standby supervision without walker. Continue plan of care.

## 2021-03-17 NOTE — Progress Notes (Signed)
SATURATION QUALIFICATIONS: (This note is used to comply with regulatory documentation for home oxygen)  Patient Saturations on Room Air at Rest = 96% Patient Saturations on Room Air while Ambulating = 92%  Patient Saturations on 2 Liters of oxygen while Ambulating = 96%  Please briefly explain why patient needs home oxygen:

## 2021-03-17 NOTE — TOC Transition Note (Signed)
Transition of Care [facility]) - CM/SW Discharge Note   Patient Details  Name: Charles Middleton MRN: [id number] Date of Birth: 17-Apr-1925  [facility] of [facility] CM/SW Contact:  Salome Arnt, LCSW Phone Number: 03/17/2021, 11:15 AM   Clinical Narrative:   Pt d/c today and will return home. Home health ordered. LCSW discussed with pt who requests [facility]. Referral sent. LCSW received call from [facility] stating that pt's listed PCP is no longer in practice. LCSW attempted to reach pt but pt had already left hospital. [facility] following up with pt for information.     Final next level of care: [place]     Patient Goals and CMS Choice Patient states their goals for this hospitalization and ongoing recovery are:: return home   Choice offered to / list presented to : Patient  Discharge Placement                       Discharge Plan and Services                DME Arranged: N/A DME Agency: NA       HH Arranged: RN,PT [place] Agency: [place] Date [place]: 03/17/21 Time [place]: 4098    Social Determinants of Health (SDOH) Interventions     Readmission Risk Interventions No flowsheet data found.

## 2021-03-17 NOTE — Discharge Summary (Signed)
Physician Discharge Summary  Charles Middleton GTX:[id number] DOB: 01/22/1925 DOA: 03/15/2021  PCP: [name], MD  Admit date: 03/15/2021  Discharge date: 03/17/2021  Admitted From:Home  Disposition:  Home  Recommendations for Outpatient Follow-up:  1. Follow up with PCP in 1-2 weeks 2. Follow-up with neurology Dr. Merlene Laughter in 2 months and have repeat imaging in approximately 3 months for reassessment 3. Continue aspirin 81 mg daily 4. Rescue inhaler prescribed for any wheezing or shortness of breath concerns 5. Added hydralazine 25 mg 3 times daily for better blood pressure control, follow-up with PCP  Home Health: Yes with PT, RN, CSW  Equipment/Devices: None, has home oxygen at bedtime  Discharge Condition:Stable  CODE STATUS: DNR  Diet recommendation: Heart Healthy  Brief/Interim Summary:  Charles Middleton a 87 y.o.malewith medical history significant ofHTN, GERD, HOH, COPD but not oxygen or steroid dependent. [facility]. His daughter reportsdifficulty with elevated blood pressure at home.His daughter is been checking his blood pressure. She alsostates he is acting sluggish. For the last few days he has been having intermittent double visionbutnot present currently.  Patient was admitted for evaluation of intermittent diplopia as well as poorly controlled hypertension.  Brain MRI revealing questionable mass suspicious for glioma versus lacunar infarct according to neurology.  He continues to have some intermittent symptoms, but is overall stable for discharge and blood pressures are better controlled with the addition of hydralazine.  Recommendations as noted above and he has been seen by PT with recommendations for home health.  No other acute events noted throughout the course of this admission.  He will have follow-up with neurology in 2 months.  Discharge Diagnoses:  Active Problems:   HTN (hypertension)   Transient  diplopia   COPD (chronic obstructive pulmonary disease) (HCC)   Grief reaction   PAD (peripheral artery disease) (HCC)   Diplopia  Principal discharge diagnosis: Intermittent diplopia with weakness and poorly controlled hypertension likely related to lacunar infarct versus glioma.  Discharge Instructions  Discharge Instructions    Diet - low sodium heart healthy   Complete by: As directed    Increase activity slowly   Complete by: As directed      Allergies as of 03/17/2021      Reactions   Amlodipine Other (See Comments)   Swelling in feet and legs   Bee Venom Other (See Comments)   Vision changes      Medication List    TAKE these medications   albuterol 108 (90 Base) MCG/ACT inhaler Commonly known as: VENTOLIN HFA Inhale 2 puffs into the lungs every 6 (six) hours as needed for wheezing or shortness of breath.   aspirin 81 MG EC tablet Take 1 tablet (81 mg total) by mouth daily. Swallow whole.   Complete Tabs Take 1 tablet by mouth daily.   digoxin 0.125 MG tablet Commonly known as: LANOXIN Take 0.125 mg by mouth every other day.   feeding supplement Liqd Take 237 mLs by mouth 2 (two) times daily between meals.   FISH OIL PO Take 1 capsule by mouth daily.   hydrALAZINE 25 MG tablet Commonly known as: APRESOLINE Take 1 tablet (25 mg total) by mouth every 8 (eight) hours.   hydrochlorothiazide 12.5 MG capsule Commonly known as: MICROZIDE Take 12.5 mg by mouth daily.   ibuprofen 600 MG tablet Commonly known as: ADVIL Take 1 tablet (600 mg total) by mouth every 6 (six) hours as needed.   irbesartan 150  MG tablet Commonly known as: AVAPRO Take 150 mg by mouth daily.   methocarbamol 500 MG tablet Commonly known as: ROBAXIN Take 1 tablet (500 mg total) by mouth 2 (two) times daily as needed for muscle spasms.   potassium chloride SA 20 MEQ tablet Commonly known as: KLOR-CON Take 20 mEq by mouth daily.   rosuvastatin 40 MG tablet Commonly known as:  CRESTOR Take 40 mg by mouth at bedtime.   Toprol XL 50 MG 24 hr tablet Generic drug: metoprolol succinate Take 75 mg by mouth daily.   Tudorza Pressair 400 MCG/ACT Aepb Generic drug: Aclidinium Bromide Inhale 1 Inhaler into the lungs daily.   Vitamin C 500 MG Caps Take 1,000 mg by mouth daily.       Follow-up Information    [name], MD. Schedule an appointment as soon as possible for a visit in 1 week(s).   Specialty: Family Medicine Contact information: [address]. [place] [address] [phone]        Phillips Odor, MD. Schedule an appointment as soon as possible for a visit in 2 month(s).   Specialty: Neurology Contact information: [address] [place] [address] [phone]              Allergies  Allergen Reactions  . Amlodipine Other (See Comments)    Swelling in feet and legs  . Bee Venom Other (See Comments)    Vision changes    Consultations:  Neurology   Procedures/Studies: CT Head Wo Contrast  Result Date: 03/15/2021 CLINICAL DATA:  Dizziness. Hypertension and double vision for 2 days. EXAM: CT HEAD WITHOUT CONTRAST TECHNIQUE: Contiguous axial images were obtained from the base of the skull through the vertex without intravenous contrast. COMPARISON:  None. FINDINGS: Brain: No evidence of acute infarction, hemorrhage, hydrocephalus, extra-axial collection or mass lesion/mass effect. There is mild diffuse low-attenuation within the subcortical and periventricular white matter compatible with chronic microvascular disease. Prominence of sulci and ventricles compatible with brain atrophy Vascular: No hyperdense vessel or unexpected calcification. Skull: Normal. Negative for fracture or focal lesion. Sinuses/Orbits: Mild right maxillary sinus mucosal thickening. The remaining paranasal sinuses are clear. Normal appearance of the right maxillary sinus. Signs of previous left mastoidectomy identified. Other: None. IMPRESSION: 1.  No acute intracranial abnormalities. 2. Chronic small vessel ischemic disease and brain atrophy. Electronically Signed   By: Kerby Moors M.D.   On: 03/15/2021 15:49   MR BRAIN WO CONTRAST  Result Date: 03/16/2021 CLINICAL DATA:  Transient ischemic attack (TIA). Additional history provided: Bilateral weakness for 2 days. EXAM: MRI HEAD WITHOUT CONTRAST TECHNIQUE: Multiplanar, multiecho pulse sequences of the brain and surrounding structures were obtained without intravenous contrast. COMPARISON:  Prior head CT 03/15/2021. FINDINGS: Brain: Mild intermittent motion degradation. Mild-to-moderate cerebral atrophy. Chronic small-vessel infarct within the left corona radiata/basal ganglia. Background mild multifocal T2/FLAIR hyperintensity within the cerebral white matter is nonspecific, but compatible with chronic small vessel ischemic disease. Additionally, there is an 11 mm T2/FLAIR hyperintense and T1 hypointense lesion within the inferomedial right frontal lobe, immediately anterior and inferior to the anterior commissure (for instance as seen on series 20, images 13-15) (series 18, image 9) (series 21, image 27) (series 22, image 19). The lesion has a slightly expansile appearance. There is no acute infarct. No chronic intracranial blood products. No extra-axial fluid collection. No midline shift. Vascular: Expected proximal arterial flow voids. Skull and upper cervical spine: No focal marrow lesion. Incompletely assessed upper cervical spondylosis. C3-C4 grade 1 retrolisthesis.  Sinuses/Orbits: Visualized orbits show no acute finding. Paranasal sinus mucosal thickening. Most notably, moderate mucosal thickening is present within the left ethmoid and right maxillary sinuses. Impression #3 will be called to the ordering clinician or representative by the Radiologist Assistant, and communication documented in the PACS or [employer]. IMPRESSION: Mildly motion degraded exam. No evidence of acute infarction.  11 mm parenchymal lesion within the inferomedial right frontal lobe, immediately anterior and inferior to the anterior commissure as described. While this lesion is indeterminate in etiology, it has a subtly expansile appearance and a primary CNS neoplasm such as glioma cannot be excluded. Contrast-enhanced MR imaging of the brain is recommended for further characterization. Additionally, MRI surveillance is recommended. 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 Background mild cerebral white matter chronic small vessel ischemic disease. Mild-to-moderate cerebral atrophy. Electronically Signed   By: Kellie Simmering DO   On: 03/16/2021 12:01   DG Chest Portable 1 View  Result Date: 03/15/2021 CLINICAL DATA:  Hypertension. Patient reports double vision for 2 days. EXAM: PORTABLE CHEST 1 VIEW COMPARISON:  None. FINDINGS: Mild cardiomegaly. Aortic atherosclerosis. No pulmonary edema. Subsegmental opacities at the left lung base may be atelectasis or scarring. No confluent consolidation. No pneumothorax or significant pleural effusion. No acute osseous abnormalities are seen. There is colonic interposition under the right hemidiaphragm. IMPRESSION: 1. Mild cardiomegaly without congestive failure. 2. Subsegmental opacities at the left lung base may be atelectasis or scarring. Electronically Signed   By: Keith Rake M.D.   On: 03/15/2021 15:51      Discharge Exam: Vitals:   03/17/21 0717 03/17/21 0754  BP:  (!) 146/86  Pulse:  64  Resp:  18  Temp:  97.8 F (36.6 C)  SpO2: 96% 96%   Vitals:   03/16/21 2122 03/17/21 0420 03/17/21 0717 03/17/21 0754  BP: (!) 196/74 (!) 183/87  (!) 146/86  Pulse: 64 66  64  Resp:  17  18  Temp: 97.8 F (36.6 C) 97.9 F (36.6 C)  97.8 F (36.6 C)  TempSrc: Oral Oral  Oral  SpO2: 94% 95% 96% 96%  Weight:      Height:        General: Pt is alert, awake, not in acute distress Cardiovascular: RRR, S1/S2 +, no rubs, no  gallops Respiratory: CTA bilaterally, no wheezing, no rhonchi Abdominal: Soft, NT, ND, bowel sounds + Extremities: no edema, no cyanosis    The results of significant diagnostics from this hospitalization (including imaging, microbiology, ancillary and laboratory) are listed below for reference.     Microbiology: Recent Results (from the past 240 hour(s))  Resp Panel by RT-PCR (Flu A&B, Covid) Nasopharyngeal Swab     Status: None   Collection Time: 03/15/21  4:06 PM   Specimen: Nasopharyngeal Swab; Nasopharyngeal(NP) swabs in vial transport medium  Result Value Ref Range Status   SARS Coronavirus 2 by RT PCR NEGATIVE NEGATIVE Final    Comment: (NOTE) SARS-CoV-2 target nucleic acids are NOT DETECTED.  The SARS-CoV-2 RNA is generally detectable in upper respiratory specimens during the acute phase of infection. The lowest concentration of SARS-CoV-2 viral copies this assay can detect is 138 copies/mL. A negative result does not preclude SARS-Cov-2 infection and should not be used as the sole basis for treatment or other patient management decisions. A negative result may occur with  improper specimen collection/handling, submission of specimen other than nasopharyngeal swab, presence of viral mutation(s) within the areas targeted by this assay, and inadequate number of  viral copies(<138 copies/mL). A negative result must be combined with clinical observations, patient history, and epidemiological information. The expected result is Negative.  Fact Sheet for Patients:  EntrepreneurPulse.com.au  Fact Sheet for Healthcare Providers:  IncredibleEmployment.be  This test is no t yet approved or cleared by the [place] FDA and  has been authorized for detection and/or diagnosis of SARS-CoV-2 by FDA under an Emergency Use Authorization (EUA). This EUA will remain  in effect (meaning this test can be used) for the duration of the COVID-19  declaration under Section 564(b)(1) of the Act, 21 U.S.C.section 360bbb-3(b)(1), unless the authorization is terminated  or revoked sooner.       Influenza A by PCR NEGATIVE NEGATIVE Final   Influenza B by PCR NEGATIVE NEGATIVE Final    Comment: (NOTE) The Xpert Xpress SARS-CoV-2/FLU/RSV plus assay is intended as an aid in the diagnosis of influenza from Nasopharyngeal swab specimens and should not be used as a sole basis for treatment. Nasal washings and aspirates are unacceptable for Xpert Xpress SARS-CoV-2/FLU/RSV testing.  Fact Sheet for Patients: EntrepreneurPulse.com.au  Fact Sheet for Healthcare Providers: IncredibleEmployment.be  This test is not yet approved or cleared by the [place] FDA and has been authorized for detection and/or diagnosis of SARS-CoV-2 by FDA under an Emergency Use Authorization (EUA). This EUA will remain in effect (meaning this test can be used) for the duration of the COVID-19 declaration under Section 564(b)(1) of the Act, 21 U.S.C. section 360bbb-3(b)(1), unless the authorization is terminated or revoked.  Performed at [facility], [address] [place], [place] [address]      Labs: BNP (last 3 results) No results for input(s): BNP in the last 8760 hours. Basic Metabolic Panel: Recent Labs  Lab 03/15/21 1440 03/15/21 1503 03/17/21 0548  NA 141 144 138  K 3.4* 3.4* 3.3*  CL 100 99 102  CO2 32  --  27  GLUCOSE 117* 109* 106*  BUN 17 20 16   CREATININE 1.07 1.10 0.89  CALCIUM 8.8*  --  9.0  MG  --   --  1.9   Liver Function Tests: Recent Labs  Lab 03/15/21 1440  AST 27  ALT 32  ALKPHOS 72  BILITOT 0.6  PROT 6.5  ALBUMIN 3.6   No results for input(s): LIPASE, AMYLASE in the last 168 hours. No results for input(s): AMMONIA in the last 168 hours. CBC: Recent Labs  Lab 03/15/21 1440 03/15/21 1503  WBC 9.2  --   NEUTROABS 4.5  --   HGB 14.8 15.6  HCT 47.9 46.0  MCV 97.2  --    PLT 136*  --    Cardiac Enzymes: No results for input(s): CKTOTAL, CKMB, CKMBINDEX, TROPONINI in the last 168 hours. BNP: Invalid input(s): POCBNP CBG: No results for input(s): GLUCAP in the last 168 hours. D-Dimer No results for input(s): DDIMER in the last 72 hours. Hgb A1c Recent Labs    03/16/21 0537  HGBA1C 5.8*   Lipid Profile Recent Labs    03/16/21 0537  CHOL 103  HDL 33*  LDLCALC 51  TRIG 93  CHOLHDL 3.1   Thyroid function studies No results for input(s): TSH, T4TOTAL, T3FREE, THYROIDAB in the last 72 hours.  Invalid input(s): FREET3 Anemia work up No results for input(s): VITAMINB12, FOLATE, FERRITIN, TIBC, IRON, RETICCTPCT in the last 72 hours. Urinalysis    Component Value Date/Time   COLORURINE YELLOW 03/15/2021 1800   APPEARANCEUR HAZY (A) 03/15/2021 1800   LABSPEC 1.017 03/15/2021 1800  PHURINE 6.0 03/15/2021 1800   GLUCOSEU NEGATIVE 03/15/2021 1800   HGBUR SMALL (A) 03/15/2021 1800   BILIRUBINUR NEGATIVE 03/15/2021 1800   KETONESUR NEGATIVE 03/15/2021 1800   PROTEINUR NEGATIVE 03/15/2021 1800   NITRITE NEGATIVE 03/15/2021 1800   LEUKOCYTESUR LARGE (A) 03/15/2021 1800   Sepsis Labs Invalid input(s): PROCALCITONIN,  WBC,  LACTICIDVEN Microbiology Recent Results (from the past 240 hour(s))  Resp Panel by RT-PCR (Flu A&B, Covid) Nasopharyngeal Swab     Status: None   Collection Time: 03/15/21  4:06 PM   Specimen: Nasopharyngeal Swab; Nasopharyngeal(NP) swabs in vial transport medium  Result Value Ref Range Status   SARS Coronavirus 2 by RT PCR NEGATIVE NEGATIVE Final    Comment: (NOTE) SARS-CoV-2 target nucleic acids are NOT DETECTED.  The SARS-CoV-2 RNA is generally detectable in upper respiratory specimens during the acute phase of infection. The lowest concentration of SARS-CoV-2 viral copies this assay can detect is 138 copies/mL. A negative result does not preclude SARS-Cov-2 infection and should not be used as the sole basis for  treatment or other patient management decisions. A negative result may occur with  improper specimen collection/handling, submission of specimen other than nasopharyngeal swab, presence of viral mutation(s) within the areas targeted by this assay, and inadequate number of viral copies(<138 copies/mL). A negative result must be combined with clinical observations, patient history, and epidemiological information. The expected result is Negative.  Fact Sheet for Patients:  EntrepreneurPulse.com.au  Fact Sheet for Healthcare Providers:  IncredibleEmployment.be  This test is no t yet approved or cleared by the [place] FDA and  has been authorized for detection and/or diagnosis of SARS-CoV-2 by FDA under an Emergency Use Authorization (EUA). This EUA will remain  in effect (meaning this test can be used) for the duration of the COVID-19 declaration under Section 564(b)(1) of the Act, 21 U.S.C.section 360bbb-3(b)(1), unless the authorization is terminated  or revoked sooner.       Influenza A by PCR NEGATIVE NEGATIVE Final   Influenza B by PCR NEGATIVE NEGATIVE Final    Comment: (NOTE) The Xpert Xpress SARS-CoV-2/FLU/RSV plus assay is intended as an aid in the diagnosis of influenza from Nasopharyngeal swab specimens and should not be used as a sole basis for treatment. Nasal washings and aspirates are unacceptable for Xpert Xpress SARS-CoV-2/FLU/RSV testing.  Fact Sheet for Patients: EntrepreneurPulse.com.au  Fact Sheet for Healthcare Providers: IncredibleEmployment.be  This test is not yet approved or cleared by the [place] FDA and has been authorized for detection and/or diagnosis of SARS-CoV-2 by FDA under an Emergency Use Authorization (EUA). This EUA will remain in effect (meaning this test can be used) for the duration of the COVID-19 declaration under Section 564(b)(1) of the Act, 21  U.S.C. section 360bbb-3(b)(1), unless the authorization is terminated or revoked.  Performed at [facility], [address], [place], [place] [address]      Time coordinating discharge: 35 minutes  SIGNED:   Rodena Goldmann, DO [facility] 03/17/2021, 9:22 AM  If 7PM-7AM, please contact night-coverage [email or web address]

## 2022-08-20 DEATH — deceased

## 2022-12-03 IMAGING — DX DG CHEST 1V PORT
1 series · 1 of 1 positions shown · non-contrast
Comparison: None.

CLINICAL DATA: Hypertension. Patient reports double vision for 2
days.

EXAM:
PORTABLE CHEST 1 VIEW

[chest ap]
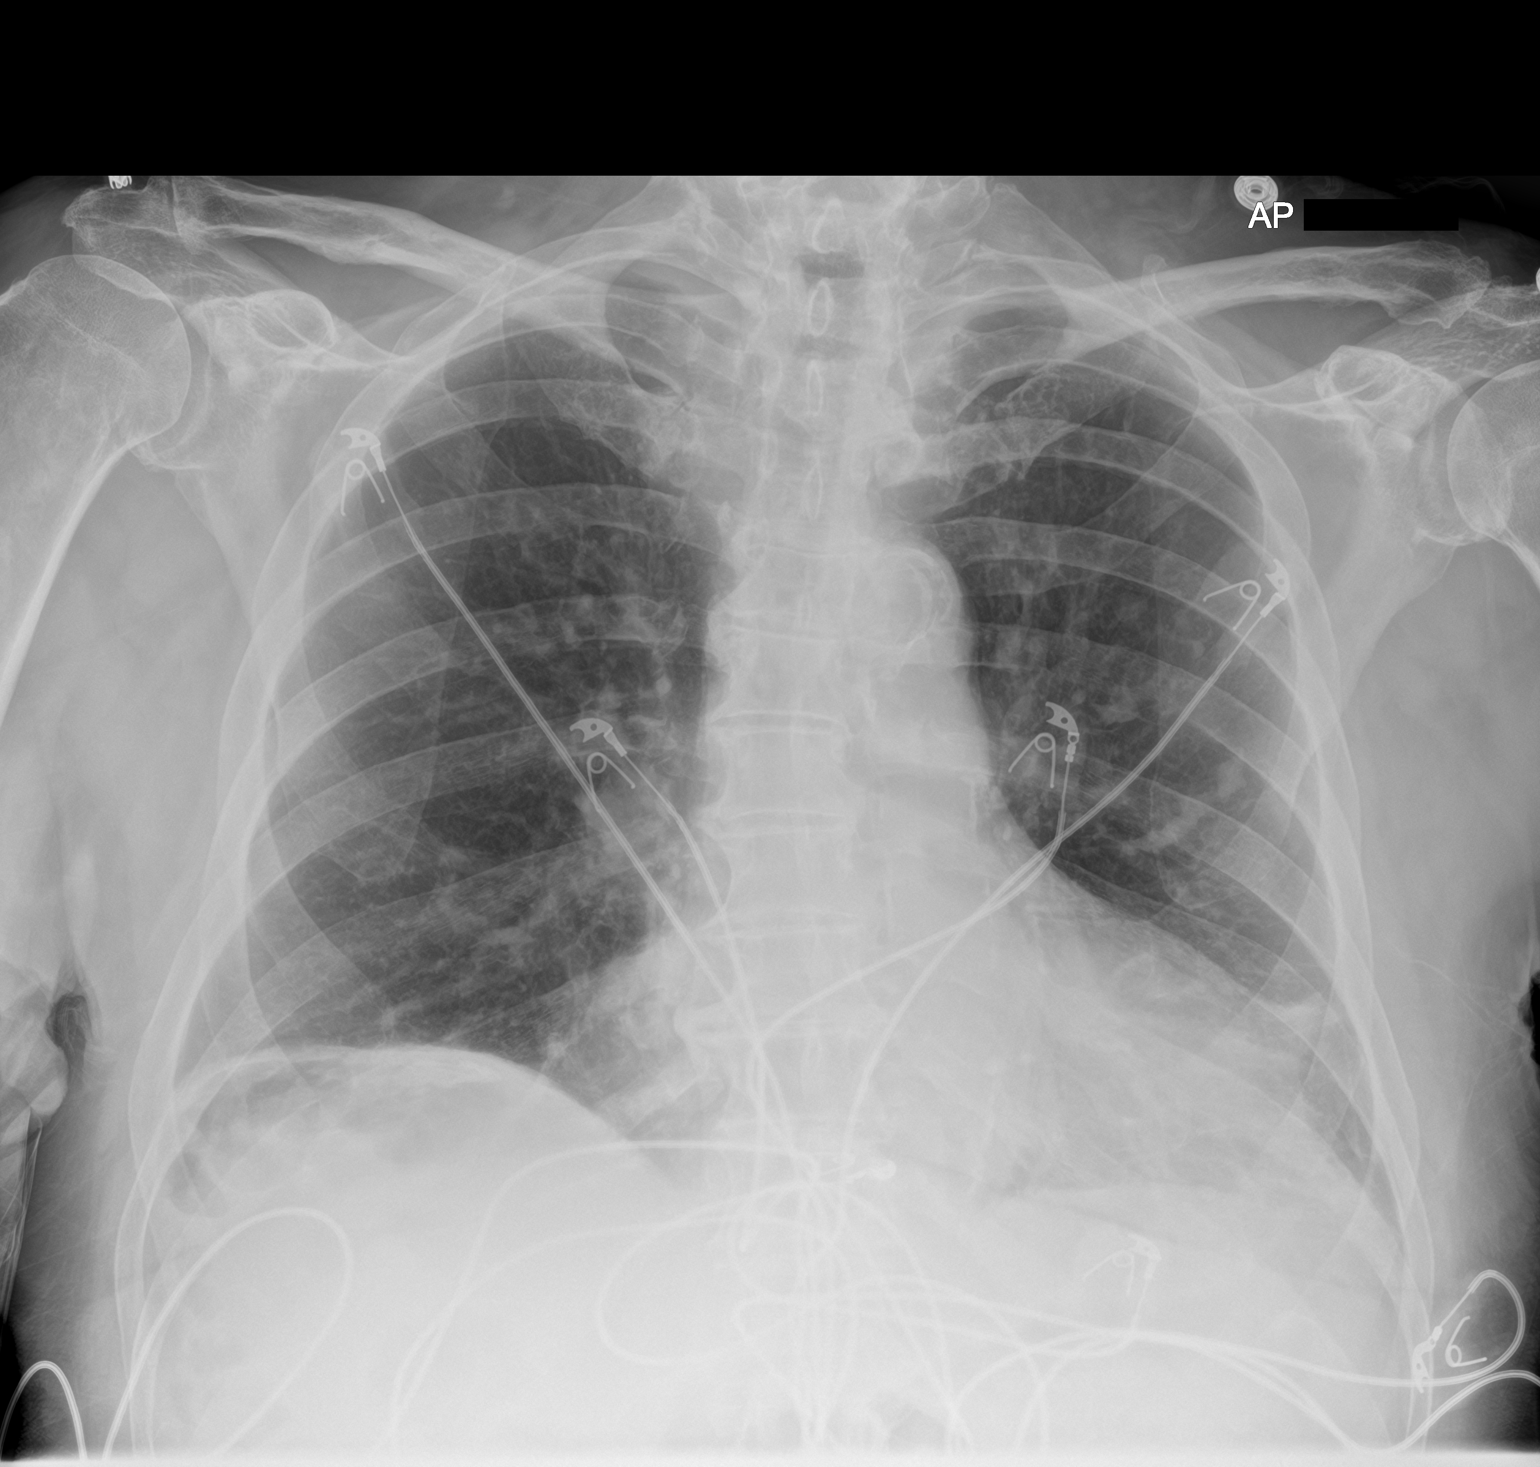

[1 of 1 positions shown; findings below may reference images not displayed]

FINDINGS: Mild cardiomegaly. Aortic atherosclerosis. No pulmonary edema.
Subsegmental opacities at the left lung base may be atelectasis or
scarring. No confluent consolidation. No pneumothorax or significant
pleural effusion. No acute osseous abnormalities are seen. There is
colonic interposition under the right hemidiaphragm.
IMPRESSION: 1. Mild cardiomegaly without congestive failure.
2. Subsegmental opacities at the left lung base may be atelectasis
or scarring.
